# Patient Record
Sex: Male | Born: 1950 | Race: White | Hispanic: No | Marital: Married | Smoking: Never smoker
Health system: Southern US, Community
[De-identification: ages and names within clinical notes are randomized; demographics above are authoritative.]

## PROBLEM LIST (undated history)

## (undated) DIAGNOSIS — C801 Malignant (primary) neoplasm, unspecified: Secondary | ICD-10-CM

## (undated) DIAGNOSIS — I10 Essential (primary) hypertension: Secondary | ICD-10-CM

---

## 2012-11-03 ENCOUNTER — Telehealth: Payer: Self-pay | Admitting: Internal Medicine

## 2012-11-03 NOTE — Telephone Encounter (Signed)
PT SCHEDULED PER MOHAMED 09/10 @ 2:30.

## 2012-11-04 ENCOUNTER — Encounter: Payer: Self-pay | Admitting: Medical Oncology

## 2012-11-04 NOTE — Progress Notes (Unsigned)
Per Cameron Berry in med records she is waiting for [facility] to fax records. Abel to access med list and allergies per care Everywhere

## 2012-11-05 ENCOUNTER — Encounter: Payer: Self-pay | Admitting: Internal Medicine

## 2012-11-05 ENCOUNTER — Other Ambulatory Visit: Payer: BC Managed Care – PPO | Admitting: Lab

## 2012-11-05 ENCOUNTER — Ambulatory Visit (HOSPITAL_BASED_OUTPATIENT_CLINIC_OR_DEPARTMENT_OTHER): Payer: BC Managed Care – PPO | Admitting: Internal Medicine

## 2012-11-05 ENCOUNTER — Ambulatory Visit: Payer: BC Managed Care – PPO

## 2012-11-05 DIAGNOSIS — C3490 Malignant neoplasm of unspecified part of unspecified bronchus or lung: Secondary | ICD-10-CM

## 2012-11-05 NOTE — Progress Notes (Signed)
Checked in new patient. Had to enter all. email for communication. No financial issues.

## 2012-11-05 NOTE — Progress Notes (Signed)
[facility] [phone]   [phone] Multidisciplinary thoracic oncology clinic (MTOC) CONSULT NOTE  REFERRING PHYSICIAN: Dr. Caryl Never  REASON FOR CONSULTATION:  62 years old white male with metastatic non-small cell lung cancer for evaluation of immunotherapy clinical trial.  HPI Cameron Berry is a 62 y.o. male is a never smoker from [place], [place] with past medical history significant for atrial fibrillation status post ablation, history of superficial melanoma status post resection as well as history of kidney stone status post lithotripsy and history of hypertension. The patient mentions that in February of 2014 he started complaining of pain in the right hip area. He was seen by his primary care physician and referred to orthopedics. Limited bone scan done at [facility] on 04/18/2012 revealed diffuse uptake in the right hemipelvis involving the right superior pubic ramus, a right acetabulum, right sacrum and right iliac region. This was followed by MRI of the pelvis on 02/23/2013 and it showed multiple osseous lesions in the bony pelvis with the largest measuring 9.5 CM and infiltrating into the right acetabular area involving the majority of the right neck being service. He was referred to [facility] orthopedics and CT scan of the chest showed a large right-sided pleural effusion with pleural nodularity, subcarinal lymphadenopathy and 7 mm nodule. CT-guided biopsy of the right acetabulum was performed on 04/30/2012 and confirmed the presence of adenocarcinoma consistent with a lung primary. On 05/07/2012 the patient underwent therapeutic and diagnostic right thoracentesis and the cytology was sent for molecular testing including EGFR mutation as well as ALK gene translocation. They mutation analysis that showed positive EGFR mutation in exon 21 (L858R). The patient has negative ALK gene translocation. The patient was seen by radiation oncology and  treated with palliative radiotherapy to the right acetabulum and pelvic lesions for a total of 10 fractions completed in April 2014. He was referred to Dr. Jerre Simon. He was started on treatment with Tarceva 50 mg by mouth daily in April 2014 for around 6 weeks. Restaging scans showed poor response to the treatment. The patient mentions that his dose was increased to 75 mg by mouth daily but he was also started on treatment with carboplatin, Alimta with Neulasta support. First dose was given on 07/08/2012. He completed 4 cycles of this combination of targets therapy and chemotherapy.  Repeat CT scan of the chest, abdomen and pelvis on 10/21/2012 showed extensive right pleural based nodules and masses, compatible with metastatic disease and worsened since prior exam. There was loculated small right pleural effusion, similar to the prior. There was increased cardiophrenic adenopathy. There was sclerotic lesions in the superior endplate of T12 metastases progressed since June of 2014 and indeterminate. CT of the brain on the same day showed no evidence of metastatic disease. 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  Dr. Jerre Simon discussed with the patient several options for his treatment including increasing the dose of Tarceva at 150 mg. She also referred the patient for consideration of clinical trial with AZD 9291 if he has resistant T790M EGFR mutation.  The patient was also referred to me today for evaluation and discussion of the immunotherapy trial with the [facility] [id number] including treatment with Nivolumab.  When seen today the patient was accompanied by his wife, his daughter and her husband. He feels very good today with no specific complaints except for mild cough. The patient exercises  at regular basis. He also complains of fatigue. He denied having any significant nausea or vomiting. He  denied having any fever or chills. The patient denied having any significant chest pain, shortness of breath but continues to have mild cough with no hemoptysis. He has no significant weight loss or night sweats. He has no headache or blurry vision.     PAST MEDICAL HISTORY:Significant for history of metastatic non-small cell lung cancer, superficial melanoma status post excision, history of atrial fibrillation status post ablation , history of kidney stone status post lithotripsy, hypertension. The patient denied having any history of coronary artery disease, diabetes mellitus or stroke.  FAMILY HISTORY: mother had breast cancer, mother still alive at age 55 and maternal grandmother with breast cancer  SOCIAL HISTORY: the patient is married and has 2 daughters. He was accompanied today by his daughter Cameron Berry who was emergency department physician and her husband was also a physician. The patient was also accompanied by his wife, Cameron Berry. He works in a Programme researcher, broadcasting/film/video. He has no history of smoking and drinks alcohol occasionally with no history of drug abuse.   No Known Allergies  Current Outpatient Prescriptions  Medication Sig Dispense Refill  . ALPRAZolam (XANAX) 0.25 MG tablet Take 1 tablet by mouth 3 (three) times daily as needed. Take 1 tablet (0.25 mg total) by mouth 3 (three) times daily as needed.      Marland Kitchen denosumab (XGEVA) 120 MG/1.7ML SOLN injection Inject 120 mg into the skin every 28 (twenty-eight) days. Inject 120 mg subcutaneously every 28 (twenty-eight) days.      Marland Kitchen erlotinib (TARCEVA) 150 MG tablet Take 150 mg by mouth daily. Take 1 tablet (150 mg total) by mouth daily.      . folic acid (FOLVITE) 400 MCG tablet Take 400 mg by mouth daily. Take 800 mcg by mouth daily.      Marland Kitchen LORazepam (ATIVAN) 0.5 MG tablet Take 0.5 mg by mouth every 8 (eight) hours as needed. Take 0.5 mg by mouth every 8 (eight) hours as needed. For anxiety      . metoprolol succinate (TOPROL-XL) 50 MG 24 hr tablet  Take 50 mg by mouth 2 (two) times daily. Take 50 mg by mouth 2 (two) times daily.      Marland Kitchen zolpidem (AMBIEN) 10 MG tablet Take 10 mg by mouth at bedtime as needed. Take 1 tablet (10 mg total) by mouth nightly.      Marland Kitchen acetaminophen (TYLENOL) 500 MG tablet Take 1 tablet by mouth 3 (three) times daily. Take 1,000 mg by mouth 3 (three) times daily as needed for Pain.      . docusate sodium (STOOL SOFTENER) 100 MG capsule Take 100 mg by mouth 2 (two) times daily as needed. Take 100 mg by mouth 2 (two) times daily as needed for Constipation.      . famotidine (PEPCID) 20 MG tablet Take 20 mg by mouth 2 (two) times daily. Take 20 mg by mouth 2 (two) times daily.      Marland Kitchen ibuprofen (ADVIL,MOTRIN) 200 MG tablet Take 200 mg by mouth daily. Take 200 mg by mouth every 8 (eight) hours as needed.      . loperamide (IMODIUM A-D) 2 MG tablet Take 2 mg by mouth 4 (four) times daily as needed. Take 2 mg by mouth 4 (four) times daily as needed for Diarrhea.      . ondansetron (ZOFRAN) 8 MG tablet 8 mg 2 (two) times daily as needed.      Marland Kitchen  ondansetron (ZOFRAN-ODT) 8 MG disintegrating tablet Take 8 mg by mouth every 12 (twelve) hours as needed. Take 8 mg by mouth every 12 (twelve) hours as needed for Nausea.       No current facility-administered medications for this visit.    Review of Systems  Constitutional: negative Eyes: negative Ears, nose, mouth, throat, and face: negative Respiratory: positive for cough Cardiovascular: negative Gastrointestinal: positive for diarrhea Genitourinary:negative Integument/breast: positive for dryness and rash Hematologic/lymphatic: negative Musculoskeletal:negative Neurological: negative Behavioral/Psych: negative Endocrine: negative Allergic/Immunologic: negative  Physical Exam  ZOX:WRUEA, healthy, no distress, well nourished and well developed SKIN: positive for grade 1-2 skin rash mainly on the face, upper chest and shoulder HEAD: Normocephalic EYES: normal,  PERRLA EARS: External ears normal OROPHARYNX:no exudate and no erythema  NECK: supple, no adenopathy LYMPH:  no palpable lymphadenopathy, no hepatosplenomegaly LUNGS: clear to auscultation on the left with decreased breath sounds and dullness to percussion at the lower half of the right lung. HEART: regular rate & rhythm, no murmurs and no gallops ABDOMEN:abdomen soft, non-tender, normal bowel sounds and no masses or organomegaly BACK: Back symmetric, no curvature. EXTREMITIES:no joint deformities, effusion, or inflammation, no cyanosis  NEURO: alert & oriented x 3 with fluent speech, no focal motor/sensory deficits, gait normal  PERFORMANCE STATUS: ECOG 1 LABORATORY DATA: Recent bloodwork from [facility] performed on 10/22/2012. GFR >78ml/min: Normal Range GFR 59-30: Moderate decrease (CKD Stage 3) GFR 29-15: Severe decrease (CKD Stage 4) GFR <15: Kidney Failure (CKD Stage 5) Note: These GFR calculations do not apply in acute situations  when GFR is changing rapidly or in patients on dialysis  . Anion Gap 10/29/2012 7 7-17 mmol/L Final  . BUN/Crea Ratio 10/29/2012 11* 20-30 Final  . Lactate Dehydrogenase (LDH) 10/29/2012 236* 100-200 U/L Final  . Hemoglobin 10/22/2012 11.1* 13.7-17.3 g/dL Final  . MCV (Mean Corpuscular Volume) 10/22/2012 93.3 80.0-98.0 fL Final  . MCH (Mean Corpuscular Hemoglobin) 10/22/2012 30.8 26.5-34.0 pg Final  . Immature Granulocyte Count 10/22/2012 0.04 <1.00 10^3/L Final  . Immature Granulocyte % 10/22/2012 0.6 <1.0 % Final  . NRBC % (Nucleated Reb Blood Cell %) 10/22/2012 0.0 <=0.0 % Final  . Hematocrit 10/22/2012 33.6* 42.0-52.0 % Final  . Neutrophil % 10/22/2012 80.8* 37.0-80.0 % Final  . Lymphocyte % 10/22/2012 7.6* 10.0-50.0 % Final  . Eosinophil % 10/22/2012 0.8 0.0-7.0 % Final  . WBC (White Blood Cell Count) 10/22/2012 6.41 4.80-10.80 10^3/uL Final  . RBC (Red Blood Cell Count) 10/22/2012 3.60* 4.37-5.74 10^6/uL Final  . MCHC (Mean  Corpuscular Hemoglobin * 10/22/2012 33.0 31.5-36.3 g/dL Final  . Plt (platelets) 10/22/2012 164 150-450 10^3/uL Final  . NRBC (Nucleated Red Blood Cell Cou* 10/22/2012 0.00 0.00-2.00 10^3/uL Final  . Neutrophils 10/22/2012 5.18 2.00-8.60 10^3/uL Final  . Lymphocyte Count 10/22/2012 0.49* 0.60-4.50 10^3/uL Final  . MONOCYTE COUNT 10/22/2012 0.60 0.00-0.90 10^3/uL Final  . Eosinophils 10/22/2012 0.05 0.00-0.70 10^3/uL Final  . Basophils 10/22/2012 0.05 0.00-0.20 10^3/uL Final  . Monocyte % 10/22/2012 9.4 0.0-12.0 % Final  . Basophil% 10/22/2012 0.8 <=2.0 % Final  . RDW-CV (Red Cell Distribution Widt* 10/22/2012 15.7* 11.5-14.5 % Final  . Sodium 10/22/2012 140 135-145 mmol/L Final  . Potassium 10/22/2012 3.3* 3.5-5.0 mmol/L Final  . Chloride 10/22/2012 107 98-108 mmol/L Final  . Carbon Dioxide (CO2) 10/22/2012 26 21-30 mmol/L Final  . Glucose 10/22/2012 124 70-140 mg/dL Final  Interpretive Data:  Please note that the above listed reference range  is for NONFASTING GLUCOSE levels only. FASTING GLUCOSE  reference ranges  are shown below:  FASTING GLUCOSE REFERENCE RANGE  NORMAL: 70 - 99 mg/dL  PREDIABETES: 161 - 096 mg/dL  DIABETES: > 045 mg/dL       . Urea Nitrogen (BUN) 10/22/2012 13 7-20 mg/dL Final  . Creatinine 40/98/1191 1.1 0.6-1.3 mg/dL Final  . Calcium 47/82/9562 8.4* 8.7-10.2 mg/dL Final  . Protein, Total 10/22/2012 6.4 5.8-7.8 g/dL Final  . Albumin 13/09/6576 3.7 3.5-4.8 g/dL Final  . Bilirubin, Total 10/22/2012 0.8 0.4-1.5 mg/dL Final  . AST (Aspartate Aminotransferase) 10/22/2012 25 15-41 U/L Final  . Alk Phos (alkaline Phosphatase) 10/22/2012 68 24-110 U/L Final  . Alt (alanine Aminotransfrase) 10/22/2012 20 17-63 U/L Final  . Glomerular Filtration Rate (GFR), * 10/22/2012 >60 Final  Interpretive Ranges for Patients with Chronic Kidney Disease:  ======================================================= GFR >79ml/min: Normal Range GFR 59-30: Moderate decrease (CKD Stage  3) GFR 29-15: Severe decrease (CKD Stage 4) GFR <15: Kidney Failure (CKD Stage 5) Note: These GFR calculations do not apply in acute situations  when GFR is changing rapidly or in patients on dialysis  . Anion Gap 10/22/2012 10 7-17 mmol/L Final  . BUN/Crea Ratio 10/22/2012 12* 20-30 Final  . Lactate Dehydrogenase (LDH) 10/22/2012 222* 100-200 U/L Final      RADIOGRAPHIC STUDIES: CT abdomen with contrast - Final result (10/21/2012 9:11 AM EDT)  Narrative  CT chest and abdomen with IV contrast, dated October 21, 2012  Comparison: September 10, 2012: August 18, 2012; and priors  Indication: 162.9 Malignant neoplasm of bronchus and lung, unspecified  site, f/u lung cancer  Technique: CT imaging was performed of the chest and abdomen following the  uncomplicated administration of intravenous contrast (Isovue-300, 100 mL).  Iodinated contrast was used due to the indications for the examination, to  improve disease detection and to further define anatomy. The most recent  serum creatinine is 1.3 mg/dL. Coronal and sagittal images were also  generated and reviewed.  Findings:  Chest: The thyroid gland appears normal. The heart and great vessels are  unremarkable. There is no pericardial effusion. 0.8 cm AP window node  (series 3, image 24) is unchanged. Right hilar lymph nodes measure up to 1  cm (series 3, image 30), grossly unchanged. No left hilar or axillary  adenopathy is seen. There is cardiophrenic adenopathy; confluent adenopathy  measures up to 2.7 x 1.3 cm (series 3, image 50), increased in size since  prior.  There is extensive enhancing right pleural nodularity, involving both the  visceral and parietal pleura. This has significantly increased since September 10, 2012. Largest confluent pleurally based mass measures 1.6 x 5.2 cm  (series 3, image 47) and is newly noted. Another example of a new nodule at  the 1.1 x 1.2 cm medial right lower lobe pleural-based nodule closely  abutting  the esophagus (series 3, image 52). In the right upper lobe,  largest pleural-based nodule measures 1.3 x 0.9 cm (series 3, image 17),  previously 0.6 x 0.6 cm.  There is a loculated small right pleural effusion, similar to priors.  Linear opacities at the right lung base are similar to prior and likely  represent a combination of atelectasis and mild edema. No pulmonary nodules  are identified in the left lung.  Abdomen: No focal liver lesions are identified. The portal vein is patent.  The gallbladder, spleen, pancreas, and adrenal glands are unremarkable.  There is a 2.2 x 2.4 cm cyst at the upper pole of the left kidney, multiple  subcentimeter hypoattenuating lesions in both  kidneys that are too small to  accurately characterize on CT, and multiple bilateral nonobstructing renal  stones, similar to prior.  There is a small hiatal hernia. There is no evidence for bowel obstruction;  pelvis is not imaged, and thus only part of the bowel is visualized. No  free air or free fluid is seen. There are subcentimeter porta hepatis and  retroperitoneal lymph nodes, unchanged. There is degenerative disease in  the spine. Sclerotic lesion of the superior endplate of T12 is progressed  since June 2014, but given the location, is indeterminate.  Impression:  1. Extensive right pleural-based nodules and masses, compatible with  metastatic disease and worsened since prior. Loculated small right pleural  effusion, similar to prior.  2. Increased cardiophrenic adenopathy.  3. Sclerotic lesion at superior endplate of T12 is progressed since June  2014 and indeterminate; attention on followup.  Electronically Reviewed by: Sheran Fava, MD  Electronically Reviewed on: 10/21/2012 10:35 AM  I have reviewed the images and concur with the above findings.  Electronically Signed by: Gypsy Decant, MD  Electronically Signed on: 10/21/2012 12:35 PM     CT brain with contrast - Final result (10/21/2012  9:11 AM EDT)  Narrative  Brain CT with contrast.  Comparison: Brain MR May 07, 2012. Bone scan October 21, 2012.Marland Kitchen  Contrast: 80 ml Isovue 300. 20 ml wasted.  History: 162.9 Malignant neoplasm of bronchus and lung, unspecified site,  f/u lung cancer.  No intra-axial mass. No abnormal enhancement.  Ventricles are normal in size and configuration.  No evidence of acute cortical infarct or hemorrhage.  Visualized paranasal sinuses are clear. No fracture.  Scattered calvarial lucencies which are likely vascular given absence of  tracer abnormality on accompanying bone scan.  Stable large midline posterior fossa nonenhancing CSF fluid collection.  This likely represents an arachnoid cyst superimposed upon a giant cisterna  magna..  IMPRESSION:  No evidence of metastatic disease.  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  Electronically Signed by: Tana Conch  Electronically Signed on: 10/21/2012 1:07 PM    ASSESSMENT: This is a very pleasant 62 years old white male with metastatic non-small cell lung cancer, adenocarcinoma with positive EGFR mutation in exon 21 (L858R). The patient is status post treatment with low-dose Tarceva as a single agent followed by a combination Tarceva and chemotherapy with carboplatin and Alimta but unfortunately has evidence for disease progression.   PLAN: I had a lengthy discussion with the patient and his family today about his current disease status and treatment options. The patient is currently on treatment with Tarceva 150 mg by mouth daily status post 1 week. He is tolerating his treatment fairly well except for few episodes of diarrhea and grade 1-2 skin rash mainly on the face and upper chest and neck area.  I recommended for the patient to continue on his current dose of Tarceva for the next few week followed by restaging scan.  If he  continues to have evidence for disease progression on the current standard dose of Tarceva, this treatment will need to be discontinued. Repeat biopsy of one of the pleural-based nodules would be recommended to rule out the presence of the resistant mutation T790M.  If the patient has the resistant T790M mutation, he would benefit from enrollment in the clinical trial with [id number] or [id number].  If the patient has evidence for disease progression in the absence  of the resistant T790M mutation, I would be happy to consider him for enrollment in the immunotherapy clinical trial with Anti PD-1, Nivolumab according to the BMS [id number]. I discussed all of these options in detail with the patient and his family and explained to them the pros and cons of each option. I gave the patient and his family the time to ask questions and I answered them completely to their satisfaction. The patient will continue his current care with Dr. Jerre Simon. I would be happy to see him in the future if he is interested in the [facility] 209153 clinical trial.  For skin rash the patient will continue on clindamycin cream. For the metastatic bone disease, the patient will continue on Xgeva and I recommended for him to start taking calcium and vitamin D supplements.  The patient voices understanding of current disease status and treatment options and is in agreement with the current care plan.  All questions were answered. The patient knows to call the clinic with any problems, questions or concerns. We can certainly see the patient much sooner if necessary.  Thank you so much for allowing me to participate in the care of Cameron Berry. I will continue to follow up the patient with you and assist in his care.  I spent 60 minutes counseling the patient face to face. The total time spent in the appointment was 80 minutes.  Zacari Radick K. 11/05/2012, 5:56 PM

## 2012-11-27 ENCOUNTER — Encounter: Payer: Self-pay | Admitting: *Deleted

## 2012-11-27 DIAGNOSIS — C3490 Malignant neoplasm of unspecified part of unspecified bronchus or lung: Secondary | ICD-10-CM | POA: Insufficient documentation

## 2012-11-28 ENCOUNTER — Encounter (HOSPITAL_COMMUNITY): Payer: Self-pay

## 2012-11-28 ENCOUNTER — Other Ambulatory Visit (HOSPITAL_BASED_OUTPATIENT_CLINIC_OR_DEPARTMENT_OTHER): Payer: BC Managed Care – PPO | Admitting: Lab

## 2012-11-28 ENCOUNTER — Other Ambulatory Visit: Payer: Self-pay | Admitting: *Deleted

## 2012-11-28 ENCOUNTER — Ambulatory Visit (HOSPITAL_BASED_OUTPATIENT_CLINIC_OR_DEPARTMENT_OTHER): Payer: BC Managed Care – PPO | Admitting: Internal Medicine

## 2012-11-28 ENCOUNTER — Ambulatory Visit (HOSPITAL_COMMUNITY)
Admission: RE | Admit: 2012-11-28 | Discharge: 2012-11-28 | Disposition: A | Discharge status: Home / Self Care | Payer: BC Managed Care – PPO | Source: Ambulatory Visit | Attending: Internal Medicine | Admitting: Internal Medicine

## 2012-11-28 ENCOUNTER — Encounter: Payer: Self-pay | Admitting: Internal Medicine

## 2012-11-28 VITALS — BP 162/90 | HR 86 | Temp 98.5°F | Resp 18 | Ht 72.0 in | Wt 187.4 lb

## 2012-11-28 DIAGNOSIS — C3490 Malignant neoplasm of unspecified part of unspecified bronchus or lung: Secondary | ICD-10-CM

## 2012-11-28 DIAGNOSIS — Q619 Cystic kidney disease, unspecified: Secondary | ICD-10-CM | POA: Insufficient documentation

## 2012-11-28 DIAGNOSIS — N200 Calculus of kidney: Secondary | ICD-10-CM | POA: Insufficient documentation

## 2012-11-28 DIAGNOSIS — C7951 Secondary malignant neoplasm of bone: Secondary | ICD-10-CM

## 2012-11-28 DIAGNOSIS — C3491 Malignant neoplasm of unspecified part of right bronchus or lung: Secondary | ICD-10-CM

## 2012-11-28 DIAGNOSIS — C782 Secondary malignant neoplasm of pleura: Secondary | ICD-10-CM

## 2012-11-28 HISTORY — DX: Malignant (primary) neoplasm, unspecified: C80.1

## 2012-11-28 HISTORY — DX: Essential (primary) hypertension: I10

## 2012-11-28 LAB — T4, FREE: Free T4: 1.07 ng/dL (ref 0.80–1.80)

## 2012-11-28 LAB — CBC WITH DIFFERENTIAL/PLATELET
BASO%: 0.8 % (ref 0.0–2.0)
Basophils Absolute: 0.1 10*3/uL (ref 0.0–0.1)
EOS%: 5.3 % (ref 0.0–7.0)
Eosinophils Absolute: 0.5 10*3/uL (ref 0.0–0.5)
HGB: 13 g/dL (ref 13.0–17.1)
LYMPH%: 7.5 % — ABNORMAL LOW (ref 14.0–49.0)
MCH: 28 pg (ref 27.2–33.4)
MCHC: 33.2 g/dL (ref 32.0–36.0)
MCV: 84.4 fL (ref 79.3–98.0)
MONO#: 0.7 10*3/uL (ref 0.1–0.9)
MONO%: 8.3 % (ref 0.0–14.0)
NEUT#: 6.6 10*3/uL — ABNORMAL HIGH (ref 1.5–6.5)
NEUT%: 78.1 % — ABNORMAL HIGH (ref 39.0–75.0)
Platelets: 240 10*3/uL (ref 140–400)
RBC: 4.62 10*6/uL (ref 4.20–5.82)
RDW: 15.1 % — ABNORMAL HIGH (ref 11.0–14.6)
WBC: 8.4 10*3/uL (ref 4.0–10.3)

## 2012-11-28 LAB — COMPREHENSIVE METABOLIC PANEL (CC13)
AST: 23 U/L (ref 5–34)
Albumin: 3.5 g/dL (ref 3.5–5.0)
Alkaline Phosphatase: 93 U/L (ref 40–150)
BUN: 11.8 mg/dL (ref 7.0–26.0)
CO2: 26 mEq/L (ref 22–29)
Creatinine: 1.1 mg/dL (ref 0.7–1.3)
Glucose: 87 mg/dl (ref 70–140)
Potassium: 3.8 mEq/L (ref 3.5–5.1)
Sodium: 142 mEq/L (ref 136–145)
Total Protein: 7.4 g/dL (ref 6.4–8.3)

## 2012-11-28 LAB — HEPATITIS B SURFACE ANTIGEN: Hepatitis B Surface Ag: NEGATIVE

## 2012-11-28 LAB — TSH CHCC: TSH: 1.621 m(IU)/L (ref 0.320–4.118)

## 2012-11-28 MED ORDER — IOHEXOL 300 MG/ML  SOLN
100.0000 mL | Freq: Once | INTRAMUSCULAR | Status: AC | PRN
  Administered 2012-11-28: 100 mL via INTRAVENOUS

## 2012-11-28 NOTE — Progress Notes (Signed)
[facility] [phone]   [phone]  OFFICE PROGRESS NOTE  [name], MD [address] [facility] [place] [address]  DIAGNOSIS: metastatic non-small cell lung cancer, adenocarcinoma with positive EGFR mutation in exon 21 (L858R) diagnosed in February of 2014. Recent biopsy showed negative resistant mutation T790M.  PRIOR THERAPY: 1) Palliative radiotherapy to the right acetabulum and pelvic lesions for a total of 10 fractions completed in April 2014. 2) Treatment with Tarceva 50 mg by mouth daily in April 2014 for around 6 weeks. Restaging scans showed poor response to the treatment. 3) Tarceva dose was increased to 75 mg by mouth daily but he was also started on treatment with carboplatin, Alimta with Neulasta support. First dose was given on 07/08/2012. He completed 4 cycles of this combination of targets therapy and chemotherapy. Chemotherapy was discontinued secondary to disease progression. 4) Tarceva 150 mg by mouth daily for one month's discontinued on 11/24/2012 secondary to with significant disease progression in the right pleural space.  5) rebiopsy for evaluation of the resistant mutation T790M was negative.  CURRENT THERAPY:the patient is here today for evaluation and consideration of the Immunotherapy clinical trial BMS CA [id number] for treatment with Nivolumab  INTERVAL HISTORY: Cameron Berry 62 y.o. male returns to the clinic today for follow up visit accompanied by his wife. The patient is feeling fine today with no specific complaints. He still very active and exercises at regular basis. The patient recently had a biopsy of the right pleural based mass and the molecular studies showed negative resistant mutation T790M. His recent CT scan of the chest at [facility] showed significant disease progression in the right pleural based masses. Dr. Jerre Simon kindly referred the patient back to me today for  evaluation and consideration of enrollment in the immunotherapy clinical trial with Nivolumab. The patient has no complaints today. He denied having any significant chest pain, shortness of breath, cough or hemoptysis. He denied having any weight loss or night sweats. He has no nausea or vomiting. He denied having any significant fever or chills.  ALLERGIES:  has No Known Allergies.  MEDICATIONS:  Current Outpatient Prescriptions  Medication Sig Dispense Refill  . acetaminophen (TYLENOL) 500 MG tablet Take 1 tablet by mouth 3 (three) times daily. Take 1,000 mg by mouth 3 (three) times daily as needed for Pain.      Marland Kitchen ALPRAZolam (XANAX) 0.25 MG tablet Take 1 tablet by mouth 3 (three) times daily as needed. Take 1 tablet (0.25 mg total) by mouth 3 (three) times daily as needed.      . clindamycin (CLINDAGEL) 1 % gel       . denosumab (XGEVA) 120 MG/1.7ML SOLN injection Inject 120 mg into the skin every 28 (twenty-eight) days. Inject 120 mg subcutaneously every 28 (twenty-eight) days.      Marland Kitchen docusate sodium (STOOL SOFTENER) 100 MG capsule Take 100 mg by mouth 2 (two) times daily as needed. Take 100 mg by mouth 2 (two) times daily as needed for Constipation.      . erlotinib (TARCEVA) 150 MG tablet Take 150 mg by mouth daily. Take 1 tablet (150 mg total) by mouth daily.      . famotidine (PEPCID) 20 MG tablet Take 20 mg by mouth 2 (two) times daily. Take 20 mg by mouth 2 (two) times daily.      . folic acid (FOLVITE) 400 MCG tablet Take 400 mg by mouth daily. Take  800 mcg by mouth daily.      Marland Kitchen ibuprofen (ADVIL,MOTRIN) 200 MG tablet Take 200 mg by mouth daily. Take 200 mg by mouth every 8 (eight) hours as needed.      . loperamide (IMODIUM A-D) 2 MG tablet Take 2 mg by mouth 4 (four) times daily as needed. Take 2 mg by mouth 4 (four) times daily as needed for Diarrhea.      Marland Kitchen LORazepam (ATIVAN) 0.5 MG tablet Take 0.5 mg by mouth every 8 (eight) hours as needed. Take 0.5 mg by mouth every 8 (eight) hours  as needed. For anxiety      . metoprolol succinate (TOPROL-XL) 50 MG 24 hr tablet Take 50 mg by mouth 2 (two) times daily. Take 50 mg by mouth 2 (two) times daily.      . ondansetron (ZOFRAN) 8 MG tablet 8 mg 2 (two) times daily as needed.      . ondansetron (ZOFRAN-ODT) 8 MG disintegrating tablet Take 8 mg by mouth every 12 (twelve) hours as needed. Take 8 mg by mouth every 12 (twelve) hours as needed for Nausea.      Marland Kitchen zolpidem (AMBIEN) 10 MG tablet Take 10 mg by mouth at bedtime as needed. Take 1 tablet (10 mg total) by mouth nightly.       No current facility-administered medications for this visit.    SURGICAL HISTORY: History reviewed. No pertinent past surgical history.  REVIEW OF SYSTEMS:  Constitutional: negative Eyes: negative Ears, nose, mouth, throat, and face: negative Respiratory: negative Cardiovascular: negative Gastrointestinal: negative Genitourinary:negative Integument/breast: negative Hematologic/lymphatic: negative Musculoskeletal:negative Neurological: negative Behavioral/Psych: negative Endocrine: negative Allergic/Immunologic: negative   PHYSICAL EXAMINATION: General appearance: alert, cooperative and no distress Head: Normocephalic, without obvious abnormality, atraumatic Neck: no adenopathy, no JVD, supple, symmetrical, trachea midline and thyroid not enlarged, symmetric, no tenderness/mass/nodules Lymph nodes: Cervical, supraclavicular, and axillary nodes normal. Resp: diminished breath sounds RLL and dullness to percussion RLL Back: symmetric, no curvature. ROM normal. No CVA tenderness. Cardio: regular rate and rhythm, S1, S2 normal, no murmur, click, rub or gallop GI: soft, non-tender; bowel sounds normal; no masses,  no organomegaly Extremities: extremities normal, atraumatic, no cyanosis or edema Neurologic: Alert and oriented X 3, normal strength and tone. Normal symmetric reflexes. Normal coordination and gait  ECOG PERFORMANCE STATUS: 0 -  Asymptomatic  Blood pressure 162/90, pulse 86, temperature 98.5 F (36.9 C), temperature source Oral, resp. rate 18, height 6' (1.829 m), weight 187 lb 6.4 oz (85.004 kg).  LABORATORY DATA: No results found for this basename: WBC, HGB, HCT, MCV, PLT      Chemistry   No results found for this basename: NA, K, CL, CO2, BUN, CREATININE, GLU   No results found for this basename: CALCIUM, ALKPHOS, AST, ALT, BILITOT       RADIOGRAPHIC STUDIES: CT CHEST AND ABDOMEN WITH CONTRAST  TECHNIQUE:  Multidetector CT imaging of the chest and abdomen was performed  following the standard protocol during bolus administration of  intravenous contrast.  CONTRAST: OMNIPAQUE IOHEXOL 300 MG/ML SOLN  COMPARISON: None  FINDINGS:  CT CHEST FINDINGS  Extensive pleural based tumor is identified encasing right lung.  Within the posterior-medial right lung base the tumor measures 4.8  cm in thickness, image 43/ series 2. At the level of the carina the  posterior medial tumor measures 2.4 cm, image 26/ series 2. At the  level of the thoracic inlet the paramediastinal pleural tumor  measures 10 mm in thickness, image 15/ series  2. There is  interlobular septal thickening within the right lung which may  indicate lymphangitic spread of tumor.  The trachea appears patent and is midline. Multiple small  mediastinal lymph nodes are identified. No right paratracheal or  sub- carinal adenopathy identified. No contralateral mediastinal or  left hilar adenopathy noted. The right hilum is partially encased by  pleural tumor.  No axillary or supraclavicular adenopathy identified. Review of the  visualized bony structures shows no aggressive lytic or sclerotic  bone lesions. There is a nonspecific sclerotic focus within the T12  vertebra measuring 1.5 cm, image 60/ series 5.  CT ABDOMEN AND PELVIS FINDINGS  There is no focal liver abnormality. The gallbladder appears within  normal limits. Normal appearance  of the pancreas. The spleen is  unremarkable.  The adrenal glands both appear normal. Multiple bilateral renal  calculi are identified. The largest right renal stone measures 7 mm,  image 67/ series 2. The largest a left renal calculus measures 5 mm,  image 83/ series 2. Bilateral renal cysts are noted.  Normal caliber of the abdominal aorta. No aneurysm. No upper  abdominal adenopathy identified. No free fluid or fluid collections  identified. The upper abdominal bowel loops are within normal  limits.  Review of the visualized osseous structures shows no aggressive  lytic or sclerotic bone lesions.  IMPRESSION:  CT CHEST IMPRESSION  1. Examination is positive for extensive transpleural spread of  tumor encasing the right lung.  2. Interstitial thickening within the right base is noted. This may  indicate lymphangitic spread of tumor within the right lung. 3.  Nonspecific sclerotic focus within the T12 vertebral body. Cannot  rule out sclerotic bone metastasis. Attention on followup imaging is  advised.  CT ABDOMEN AND PELVIS IMPRESSION  1. No specific features identified to suggest metastatic disease to  the upper abdomen.  2. Bilateral renal calculi.  Electronically Signed  By: Signa Kell M.D.  On: 11/28/2012 15:38   ASSESSMENT AND PLAN: This is a very pleasant 62 years old white male with metastatic non-small cell lung cancer, adenocarcinoma with positive EGFR mutation in exon 21 (L858R) with no evidence for distant mutation T790M but unfortunately did not respond to Tarceva as expected and he continues to have evidence for disease progression especially at the right pleural based masses. He also felt systemic chemotherapy with carboplatin and Alimta. I have a lengthy discussion with the patient today about his current disease status and treatment options. I think the patient would be a good candidate for the immunotherapy clinical trial BMS CA [id number]. I discussed this option  with the patient and his wife in details. He is very interested to proceed with the clinical trial. He will be seen by the clinical research nurse later today. I will arrange for the patient to have repeat CT scan of the abdomen and pelvis as a baseline before starting the treatment. If the patient is eligible for the clinical trial, he is expected to start this treatment in early next week. He would come back for follow up visit at that time. For the metastatic bone disease, the patient will continue treatment with Xgeva 120 mg subcutaneously on monthly basis.the patient can receive this treatment either here in [place] or with Dr. Jerre Simon at [facility] depending on his convenience. I also advised the patient to take calcium and vitamin D. Supplements during his treatment with Rivka Barbara and also to keep a good dental hygiene.  The patient voices understanding of current disease  status and treatment options and is in agreement with the current care plan.  All questions were answered. The patient knows to call the clinic with any problems, questions or concerns. We can certainly see the patient much sooner if necessary.  I spent 20 minutes counseling the patient face to face. The total time spent in the appointment was 30 minutes.

## 2012-11-29 NOTE — Patient Instructions (Signed)
We discussed treatment with immunotherapy with Nivolumab. First cycle expected next week if eligible for the trial.

## 2012-12-02 ENCOUNTER — Telehealth: Payer: Self-pay | Admitting: *Deleted

## 2012-12-02 NOTE — Telephone Encounter (Signed)
Per staff message from research I have scheduled appt 

## 2012-12-02 NOTE — Telephone Encounter (Signed)
Per staff message and POF I have scheduled appts.  JMW  

## 2012-12-04 ENCOUNTER — Encounter: Payer: Self-pay | Admitting: *Deleted

## 2012-12-04 ENCOUNTER — Ambulatory Visit (HOSPITAL_BASED_OUTPATIENT_CLINIC_OR_DEPARTMENT_OTHER): Payer: Self-pay

## 2012-12-04 ENCOUNTER — Telehealth: Payer: Self-pay | Admitting: *Deleted

## 2012-12-04 ENCOUNTER — Ambulatory Visit (HOSPITAL_BASED_OUTPATIENT_CLINIC_OR_DEPARTMENT_OTHER): Payer: BC Managed Care – PPO | Admitting: Physician Assistant

## 2012-12-04 VITALS — BP 165/98 | HR 82 | Temp 98.3°F | Resp 20 | Ht 72.0 in | Wt 189.1 lb

## 2012-12-04 DIAGNOSIS — C782 Secondary malignant neoplasm of pleura: Secondary | ICD-10-CM

## 2012-12-04 DIAGNOSIS — C3410 Malignant neoplasm of upper lobe, unspecified bronchus or lung: Secondary | ICD-10-CM

## 2012-12-04 DIAGNOSIS — Z5112 Encounter for antineoplastic immunotherapy: Secondary | ICD-10-CM

## 2012-12-04 DIAGNOSIS — C3490 Malignant neoplasm of unspecified part of unspecified bronchus or lung: Secondary | ICD-10-CM

## 2012-12-04 DIAGNOSIS — C7951 Secondary malignant neoplasm of bone: Secondary | ICD-10-CM

## 2012-12-04 MED ORDER — SODIUM CHLORIDE 0.9 % IV SOLN
260.0000 mg | Freq: Once | INTRAVENOUS | Status: AC
  Administered 2012-12-04: 260 mg via INTRAVENOUS
  Filled 2012-12-04: qty 26

## 2012-12-04 MED ORDER — SODIUM CHLORIDE 0.9 % IV SOLN
Freq: Once | INTRAVENOUS | Status: AC
  Administered 2012-12-04: 14:00:00 via INTRAVENOUS

## 2012-12-04 NOTE — Progress Notes (Signed)
12/04/12 @14 :45, BMS [id number], Cycle 1 Treatment:  [name], accompanied by his wife Cameron Berry, into the [facility] to see Cameron Loft, PA and to receive his first treatment with nivolumab.  He completed baseline PROs (LCSS and EQ-5D-3L) in the lobby prior to his appointments.  His screening labs can be used for treatment today, but he will have all research labs drawn from his peripheral vein prior to initiation of nivolumab.  He is doing well, walking 3-4 times per week, reports less fatigue after having stopped Tarceva last week.  No apparent rash, has a minimal diarrhea, none since drinking oral contrast prior to CT scans last Friday, October 3rd. He will restart Norvasc 10mg  po daily, which has been on hold due to hypotension during chemotherapy treatment.  He plans to start that this evening, since his blood pressure was elevated again today.  Confirmed again that the last dose of Tarceva was on 11/28/12.  Cameron Berry was given a patient identification card for the study, and cautioned by the research nurse, the PA and the MD to call us with any diarrhea or changing in cough, shortness of breath or wheezing.  He agreed to do so.  Reminded him of the consent for as a good resource for side effects of the nivolumab.  Medication assignment through the [facility] system was done yesterday and provided to the pharmacy then.  Spoke with Adella Hare, RN in the infusion room and provided her with a sign for the pump with duration of drug and the direction to use a 0.22 micron filter for administration of the study drug.

## 2012-12-04 NOTE — Patient Instructions (Signed)
[facility] Discharge Instructions for Patients Receiving Chemotherapy  Today you received the following chemotherapy agents: nilvolumab   To help prevent nausea and vomiting after your treatment, we encourage you to take your nausea medication.  Take it as often as prescribed.     If you develop nausea and vomiting that is not controlled by your nausea medication, call the clinic. If it is after clinic hours your family physician or the after hours number for the clinic or go to the Emergency Department.   BELOW ARE SYMPTOMS THAT SHOULD BE REPORTED IMMEDIATELY:  *FEVER GREATER THAN 100.5 F  *CHILLS WITH OR WITHOUT FEVER  NAUSEA AND VOMITING THAT IS NOT CONTROLLED WITH YOUR NAUSEA MEDICATION  *UNUSUAL SHORTNESS OF BREATH  *UNUSUAL BRUISING OR BLEEDING  TENDERNESS IN MOUTH AND THROAT WITH OR WITHOUT PRESENCE OF ULCERS  *URINARY PROBLEMS  *BOWEL PROBLEMS  UNUSUAL RASH Items with * indicate a potential emergency and should be followed up as soon as possible.  One of the nurses will contact you 24 hours after your treatment. Please let the nurse know about any problems that you may have experienced. Feel free to call the clinic you have any questions or concerns. The clinic phone number is [phone].   I have been informed and understand all the instructions given to me. I know to contact the clinic, my physician, or go to the Emergency Department if any problems should occur. I do not have any questions at this time, but understand that I may call the clinic during office hours   should I have any questions or need assistance in obtaining follow up care.    __________________________________________  _____________  __________ Signature of Patient or Authorized Representative            Date                   Time    __________________________________________ Nurse's Signature

## 2012-12-04 NOTE — Progress Notes (Addendum)
[facility] [phone]   [phone]  OFFICE PROGRESS NOTE  [name], MD [address] [facility] [place] [address]  DIAGNOSIS: metastatic non-small cell lung cancer, adenocarcinoma with positive EGFR mutation in exon 21 (L858R) diagnosed in February of 2014. Recent biopsy showed negative resistant mutation T790M.  PRIOR THERAPY: 1) Palliative radiotherapy to the right acetabulum and pelvic lesions for a total of 10 fractions completed in April 2014. 2) Treatment with Tarceva 50 mg by mouth daily in April 2014 for around 6 weeks. Restaging scans showed poor response to the treatment. 3) Tarceva dose was increased to 75 mg by mouth daily but he was also started on treatment with carboplatin, Alimta with Neulasta support. First dose was given on 07/08/2012. He completed 4 cycles of this combination of targets therapy and chemotherapy. Chemotherapy was discontinued secondary to disease progression. 4) Tarceva 150 mg by mouth daily for one month's discontinued on 11/24/2012 secondary to with significant disease progression in the right pleural space.  5) rebiopsy for evaluation of the resistant mutation T790M was negative.  CURRENT THERAPY:the patient is here today to begin the Immunotherapy clinical trial BMS CA 209-153 for treatment with Nivolumab  INTERVAL HISTORY: Cameron Berry 62 y.o. male returns to the clinic today for follow up visit accompanied by his wife. The patient is feeling fine today with no specific complaints. He still very active and exercises at regular basis. The patient recently had a biopsy of the right pleural based mass and the molecular studies showed negative resistant mutation T790M. His recent CT scan of the chest at [facility] showed significant disease progression in the right pleural based masses. The patient has no complaints today except for some occasional mild shortness of breath but  he is able to perform his activities of daily living.he had some diarrhea but feels was related to the oral contrast she had a drink for his CT scan. He reports that he took his last dose of Tarceva on 11/28/2012. He reports mild headache this morning that totally resolved by taking Aleve. He denied any blurred or double vision related to the headache, nausea vomiting. He been off of his blood pressure medication but plans to resume this since his blood pressure is not well-controlled at this time. He denied having any significant chest pain, other significant shortness of breath, cough or hemoptysis. He denied having any weight loss or night sweats. He has no other nausea or vomiting. He denied having any significant fever or chills. He was found to be eligible for the BMS CA 209-153 immunotherapy clinical trial with Nivolumab.  ALLERGIES:  has No Known Allergies.  MEDICATIONS:  Current Outpatient Prescriptions  Medication Sig Dispense Refill  . acetaminophen (TYLENOL) 500 MG tablet Take 1 tablet by mouth 3 (three) times daily. Take 1,000 mg by mouth 3 (three) times daily as needed for Pain.      Marland Kitchen ALPRAZolam (XANAX) 0.25 MG tablet Take 1 tablet by mouth 3 (three) times daily as needed. Take 1 tablet (0.25 mg total) by mouth 3 (three) times daily as needed.      . Calcium Carbonate-Vitamin D 600-400 MG-UNIT per tablet Take by mouth.      . clindamycin (CLINDAGEL) 1 % gel       . denosumab (XGEVA) 120 MG/1.7ML SOLN injection Inject 120 mg into the skin every 28 (twenty-eight) days. Inject 120 mg subcutaneously every 28 (twenty-eight) days.      Marland Kitchen  docusate sodium (STOOL SOFTENER) 100 MG capsule Take 100 mg by mouth 2 (two) times daily as needed. Take 100 mg by mouth 2 (two) times daily as needed for Constipation.      . famotidine (PEPCID) 20 MG tablet Take 20 mg by mouth 2 (two) times daily. Take 20 mg by mouth 2 (two) times daily.      . folic acid (FOLVITE) 400 MCG tablet Take 400 mg by mouth daily.  Take 800 mcg by mouth daily.      Marland Kitchen ibuprofen (ADVIL,MOTRIN) 200 MG tablet Take 200 mg by mouth daily. Take 200 mg by mouth every 8 (eight) hours as needed.      . loperamide (IMODIUM A-D) 2 MG tablet Take 2 mg by mouth 4 (four) times daily as needed. Take 2 mg by mouth 4 (four) times daily as needed for Diarrhea.      Marland Kitchen LORazepam (ATIVAN) 0.5 MG tablet Take 0.5 mg by mouth every 8 (eight) hours as needed. Take 0.5 mg by mouth every 8 (eight) hours as needed. For anxiety      . metoprolol succinate (TOPROL-XL) 50 MG 24 hr tablet Take 50 mg by mouth 2 (two) times daily. Take 50 mg by mouth 2 (two) times daily.      . ondansetron (ZOFRAN) 8 MG tablet 8 mg 2 (two) times daily as needed.      . ondansetron (ZOFRAN-ODT) 8 MG disintegrating tablet Take 8 mg by mouth every 12 (twelve) hours as needed. Take 8 mg by mouth every 12 (twelve) hours as needed for Nausea.      Marland Kitchen zolpidem (AMBIEN) 10 MG tablet Take 10 mg by mouth at bedtime as needed. Take 1 tablet (10 mg total) by mouth nightly.       No current facility-administered medications for this visit.   Facility-Administered Medications Ordered in Other Visits  Medication Dose Route Frequency Provider Last Rate Last Dose  . 0.9 %  sodium chloride infusion   Intravenous Once Si Gaul, MD      . nivolumab (BMS [id number]) 260 mg in sodium chloride 0.9 % 100 mL chemo infusion  260 mg Intravenous Once Si Gaul, MD 126 mL/hr at 12/04/12 1407 260 mg at 12/04/12 1407    SURGICAL HISTORY: No past surgical history on file.  REVIEW OF SYSTEMS:  Constitutional: negative Eyes: negative Ears, nose, mouth, throat, and face: negative Respiratory: negative Cardiovascular: negative Gastrointestinal: positive for diarrhea Genitourinary:negative Integument/breast: negative Hematologic/lymphatic: negative Musculoskeletal:negative Neurological: negative except for headaches Behavioral/Psych: negative Endocrine: negative Allergic/Immunologic:  negative   PHYSICAL EXAMINATION: General appearance: alert, cooperative and no distress Head: Normocephalic, without obvious abnormality, atraumatic Neck: no adenopathy, no JVD, supple, symmetrical, trachea midline and thyroid not enlarged, symmetric, no tenderness/mass/nodules Lymph nodes: Cervical, supraclavicular, and axillary nodes normal. Resp: diminished breath sounds RLL and dullness to percussion RLL Back: symmetric, no curvature. ROM normal. No CVA tenderness. Cardio: regular rate and rhythm, S1, S2 normal, no murmur, click, rub or gallop GI: soft, non-tender; bowel sounds normal; no masses,  no organomegaly Extremities: extremities normal, atraumatic, no cyanosis or edema Neurologic: Alert and oriented X 3, normal strength and tone. Normal symmetric reflexes. Normal coordination and gait  ECOG PERFORMANCE STATUS: 0 - Asymptomatic  Blood pressure 165/98, pulse 82, temperature 98.3 F (36.8 C), temperature source Oral, resp. rate 20, height 6' (1.829 m), weight 189 lb 1.6 oz (85.775 kg), SpO2 98.00%.  LABORATORY DATA: Lab Results  Component Value Date   WBC 8.4 11/28/2012  Chemistry      Component Value Date/Time   NA 142 11/28/2012 1318      Component Value Date/Time   CALCIUM 9.6 11/28/2012 1318       RADIOGRAPHIC STUDIES: CT CHEST AND ABDOMEN WITH CONTRAST  TECHNIQUE:  Multidetector CT imaging of the chest and abdomen was performed  following the standard protocol during bolus administration of  intravenous contrast.  CONTRAST: OMNIPAQUE IOHEXOL 300 MG/ML SOLN  COMPARISON: None  FINDINGS:  CT CHEST FINDINGS  Extensive pleural based tumor is identified encasing right lung.  Within the posterior-medial right lung base the tumor measures 4.8  cm in thickness, image 43/ series 2. At the level of the carina the  posterior medial tumor measures 2.4 cm, image 26/ series 2. At the  level of the thoracic inlet the paramediastinal pleural tumor  measures 10  mm in thickness, image 15/ series 2. There is  interlobular septal thickening within the right lung which may  indicate lymphangitic spread of tumor.  The trachea appears patent and is midline. Multiple small  mediastinal lymph nodes are identified. No right paratracheal or  sub- carinal adenopathy identified. No contralateral mediastinal or  left hilar adenopathy noted. The right hilum is partially encased by  pleural tumor.  No axillary or supraclavicular adenopathy identified. Review of the  visualized bony structures shows no aggressive lytic or sclerotic  bone lesions. There is a nonspecific sclerotic focus within the T12  vertebra measuring 1.5 cm, image 60/ series 5.  CT ABDOMEN AND PELVIS FINDINGS  There is no focal liver abnormality. The gallbladder appears within  normal limits. Normal appearance of the pancreas. The spleen is  unremarkable.  The adrenal glands both appear normal. Multiple bilateral renal  calculi are identified. The largest right renal stone measures 7 mm,  image 67/ series 2. The largest a left renal calculus measures 5 mm,  image 83/ series 2. Bilateral renal cysts are noted.  Normal caliber of the abdominal aorta. No aneurysm. No upper  abdominal adenopathy identified. No free fluid or fluid collections  identified. The upper abdominal bowel loops are within normal  limits.  Review of the visualized osseous structures shows no aggressive  lytic or sclerotic bone lesions.  IMPRESSION:  CT CHEST IMPRESSION  1. Examination is positive for extensive transpleural spread of  tumor encasing the right lung.  2. Interstitial thickening within the right base is noted. This may  indicate lymphangitic spread of tumor within the right lung. 3.  Nonspecific sclerotic focus within the T12 vertebral body. Cannot  rule out sclerotic bone metastasis. Attention on followup imaging is  advised.  CT ABDOMEN AND PELVIS IMPRESSION  1. No specific features identified to  suggest metastatic disease to  the upper abdomen.  2. Bilateral renal calculi.  Electronically Signed  By: Signa Kell M.D.  On: 11/28/2012 15:38   ASSESSMENT AND PLAN: This is a very pleasant 62 years old white male with metastatic non-small cell lung cancer, adenocarcinoma with positive EGFR mutation in exon 21 (L858R) with no evidence for distant mutation T790M but unfortunately did not respond to Tarceva as expected and he continues to have evidence for disease progression especially at the right pleural based masses. He also failed systemic chemotherapy with carboplatin and Alimta. Patient was discussed with also seen by Dr. Arbutus Ped. As he is found eligible for the immunotherapy clinical trial BMS CA [id number], we'll proceed with cycle #1 today of Nivolumab. He'll return in 2 weeks as per protocol for  another symptom management visit prior to cycle #2.  For the metastatic bone disease, the patient will continue treatment with Xgeva 120 mg subcutaneously on monthly basis.the patient can receive this treatment either here in [place] or with Dr. Oran Rein at [facility] depending on his convenience. The patient is to continue to take calcium and vitamin D. supplements during his treatment with Rivka Barbara and also to keep a good dental hygiene.  Conni Slipper, PA-C    The patient voices understanding of current disease status and treatment options and is in agreement with the current care plan.  All questions were answered. The patient knows to call the clinic with any problems, questions or concerns. We can certainly see the patient much sooner if necessary.  I spent 20 minutes counseling the patient face to face. The total time spent in the appointment was 30 minutes.     ADDENDUM: Hematology/Oncology Attending: I had face to face encounter with the patient. I recommended his care plan. The patient came to the clinic today for evaluation before starting the first dose of his  immunotherapy with Nivolumab. He is feeling fine and the blood work and prescreening showed that the patient and his eligible for the clinical trial. He denied having any significant chest pain, shortness breath, cough or hemoptysis. Has only mild fatigue. He denied having any weight loss or night sweats. He would come back for followup visit in 2 weeks with the next cycle of his treatment. Lajuana Matte., MD 12/06/2012

## 2012-12-04 NOTE — Patient Instructions (Addendum)
Follow up in 2 weeks Call immediately for any concerning signs or symptoms

## 2012-12-04 NOTE — Telephone Encounter (Signed)
Per staff message and POF I have scheduled appts.  JMW  

## 2012-12-05 ENCOUNTER — Telehealth: Payer: Self-pay

## 2012-12-05 NOTE — Telephone Encounter (Signed)
Message copied by Lorine Bears on Fri Dec 05, 2012  1:10 PM ------      Message from: Adella Hare K      Created: Thu Dec 04, 2012  2:54 PM      Regarding: 1st time chemo      Contact: [phone]       [phone].            Nilvolumab - research  ------

## 2012-12-05 NOTE — Telephone Encounter (Signed)
Cameron Berry is doing fine after his treatment yesterday.  No problems with nause, vomitin, or diarrhea.  He had a headache pushed fluids and it disapated.  Told him that the Zofran can cause a headache.  He knows to call [phone] if he has any issues arise.

## 2012-12-06 ENCOUNTER — Encounter: Payer: Self-pay | Admitting: Physician Assistant

## 2012-12-08 ENCOUNTER — Inpatient Hospital Stay
Admission: RE | Admit: 2012-12-08 | Discharge: 2012-12-08 | Disposition: A | Discharge status: Home / Self Care | Payer: Self-pay | Source: Ambulatory Visit | Attending: Internal Medicine | Admitting: Internal Medicine

## 2012-12-08 ENCOUNTER — Other Ambulatory Visit: Payer: Self-pay | Admitting: Internal Medicine

## 2012-12-08 ENCOUNTER — Other Ambulatory Visit: Payer: Self-pay

## 2012-12-08 ENCOUNTER — Telehealth: Payer: Self-pay | Admitting: Internal Medicine

## 2012-12-08 NOTE — Telephone Encounter (Signed)
appts for 10/22 and 11/5 already on schedule.

## 2012-12-09 ENCOUNTER — Other Ambulatory Visit: Payer: Self-pay | Admitting: Internal Medicine

## 2012-12-09 ENCOUNTER — Inpatient Hospital Stay
Admission: RE | Admit: 2012-12-09 | Discharge: 2012-12-09 | Disposition: A | Discharge status: Home / Self Care | Payer: Self-pay | Source: Ambulatory Visit | Attending: Internal Medicine | Admitting: Internal Medicine

## 2012-12-11 ENCOUNTER — Other Ambulatory Visit: Payer: Self-pay | Admitting: Internal Medicine

## 2012-12-11 ENCOUNTER — Inpatient Hospital Stay
Admission: RE | Admit: 2012-12-11 | Discharge: 2012-12-11 | Disposition: A | Discharge status: Home / Self Care | Payer: Self-pay | Source: Ambulatory Visit | Attending: Internal Medicine | Admitting: Internal Medicine

## 2012-12-15 ENCOUNTER — Other Ambulatory Visit: Payer: Self-pay | Admitting: Internal Medicine

## 2012-12-16 ENCOUNTER — Other Ambulatory Visit (HOSPITAL_BASED_OUTPATIENT_CLINIC_OR_DEPARTMENT_OTHER): Payer: BC Managed Care – PPO | Admitting: Lab

## 2012-12-16 ENCOUNTER — Ambulatory Visit (HOSPITAL_BASED_OUTPATIENT_CLINIC_OR_DEPARTMENT_OTHER): Payer: Self-pay

## 2012-12-16 ENCOUNTER — Encounter: Payer: Self-pay | Admitting: *Deleted

## 2012-12-16 ENCOUNTER — Ambulatory Visit (HOSPITAL_BASED_OUTPATIENT_CLINIC_OR_DEPARTMENT_OTHER): Payer: BC Managed Care – PPO | Admitting: Physician Assistant

## 2012-12-16 VITALS — BP 148/72 | HR 91 | Temp 98.7°F | Resp 20 | Ht 72.0 in | Wt 189.3 lb

## 2012-12-16 DIAGNOSIS — Z5112 Encounter for antineoplastic immunotherapy: Secondary | ICD-10-CM

## 2012-12-16 DIAGNOSIS — C782 Secondary malignant neoplasm of pleura: Secondary | ICD-10-CM

## 2012-12-16 DIAGNOSIS — C3490 Malignant neoplasm of unspecified part of unspecified bronchus or lung: Secondary | ICD-10-CM

## 2012-12-16 DIAGNOSIS — C7951 Secondary malignant neoplasm of bone: Secondary | ICD-10-CM

## 2012-12-16 LAB — CBC WITH DIFFERENTIAL/PLATELET
BASO%: 1.2 % (ref 0.0–2.0)
LYMPH%: 6.4 % — ABNORMAL LOW (ref 14.0–49.0)
MCHC: 33.4 g/dL (ref 32.0–36.0)
MONO#: 1.2 10*3/uL — ABNORMAL HIGH (ref 0.1–0.9)
NEUT#: 8 10*3/uL — ABNORMAL HIGH (ref 1.5–6.5)
Platelets: 300 10*3/uL (ref 140–400)
RBC: 4.15 10*6/uL — ABNORMAL LOW (ref 4.20–5.82)
RDW: 15.9 % — ABNORMAL HIGH (ref 11.0–14.6)
WBC: 10.4 10*3/uL — ABNORMAL HIGH (ref 4.0–10.3)
lymph#: 0.7 10*3/uL — ABNORMAL LOW (ref 0.9–3.3)

## 2012-12-16 LAB — COMPREHENSIVE METABOLIC PANEL (CC13)
ALT: 22 U/L (ref 0–55)
Albumin: 2.5 g/dL — ABNORMAL LOW (ref 3.5–5.0)
Anion Gap: 11 mEq/L (ref 3–11)
BUN: 12.2 mg/dL (ref 7.0–26.0)
CO2: 24 mEq/L (ref 22–29)
Calcium: 9.1 mg/dL (ref 8.4–10.4)
Chloride: 102 mEq/L (ref 98–109)
Potassium: 3.7 mEq/L (ref 3.5–5.1)
Sodium: 138 mEq/L (ref 136–145)
Total Bilirubin: 0.47 mg/dL (ref 0.20–1.20)
Total Protein: 6.6 g/dL (ref 6.4–8.3)

## 2012-12-16 LAB — LACTATE DEHYDROGENASE (CC13): LDH: 293 U/L — ABNORMAL HIGH (ref 125–245)

## 2012-12-16 MED ORDER — SODIUM CHLORIDE 0.9 % IV SOLN
Freq: Once | INTRAVENOUS | Status: AC
  Administered 2012-12-16: 12:00:00 via INTRAVENOUS

## 2012-12-16 MED ORDER — SODIUM CHLORIDE 0.9 % IV SOLN
260.0000 mg | Freq: Once | INTRAVENOUS | Status: AC
  Administered 2012-12-16: 260 mg via INTRAVENOUS
  Filled 2012-12-16: qty 26

## 2012-12-16 NOTE — Progress Notes (Addendum)
[facility] Telephone:([phone]   [phone]  OFFICE PROGRESS NOTE  [name], MD [address] [facility] [place] [address]  DIAGNOSIS: metastatic non-small cell lung cancer, adenocarcinoma with positive EGFR mutation in exon 21 (L858R) diagnosed in February of 2014. Recent biopsy showed negative resistant mutation T790M.  PRIOR THERAPY: 1) Palliative radiotherapy to the right acetabulum and pelvic lesions for a total of 10 fractions completed in April 2014. 2) Treatment with Tarceva 50 mg by mouth daily in April 2014 for around 6 weeks. Restaging scans showed poor response to the treatment. 3) Tarceva dose was increased to 75 mg by mouth daily but he was also started on treatment with carboplatin, Alimta with Neulasta support. First dose was given on 07/08/2012. He completed 4 cycles of this combination of targets therapy and chemotherapy. Chemotherapy was discontinued secondary to disease progression. 4) Tarceva 150 mg by mouth daily for one month's discontinued on 11/24/2012 secondary to with significant disease progression in the right pleural space.  5) rebiopsy for evaluation of the resistant mutation T790M was negative.  CURRENT THERAPY:the patient is here today to begin the Immunotherapy clinical trial BMS CA 209-153 for treatment with Nivolumab. Status post 1 cycle  INTERVAL HISTORY: Cameron Berry 62 y.o. male returns to the clinic today for follow up visit accompanied by his wife. He does live just returned from a visit to [place] and did a lot of walking. He developed fever with MAXIMUM TEMPERATURE of 11.7 on Saturday and went to his local emergency room. He was placed on a course of Augmentin as well as a Z-Pak. Other than the fever he had no other specific symptoms. He reports that his cough is stable he initially had some chills but this resolved with Tylenol. He's taken Tylenol or ibuprofen off and on throughout the  weekend. His last dose of Tylenol was about 5:00 this morning when he took 2 500 mg Tylenol tablets as he felt slightly achy. He complains of some fatigue. Last time in the fever was Sunday afternoon. He voices no specific complaints today. He states that he feels fine. His appetite is good. He denies any nausea, vomiting, diarrhea or constipation. He's had no bleeding or bruising.  He denied having any weight loss or night sweats. He has no other nausea or vomiting. He denied having any significant fever or chills. He was found to be eligible for the BMS CA 209-153 immunotherapy clinical trial with Nivolumab and is status post one cycle. He presents for reevaluation of his febrile symptom as well as to proceed with cycle #2  ALLERGIES:  has No Known Allergies.  MEDICATIONS:  Current Outpatient Prescriptions  Medication Sig Dispense Refill  . acetaminophen (TYLENOL) 500 MG tablet Take 1 tablet by mouth 3 (three) times daily. Take 1,000 mg by mouth 3 (three) times daily as needed for Pain.      Marland Kitchen ALPRAZolam (XANAX) 0.25 MG tablet Take 1 tablet by mouth 3 (three) times daily as needed. Take 1 tablet (0.25 mg total) by mouth 3 (three) times daily as needed.      . Calcium Carbonate-Vitamin D 600-400 MG-UNIT per tablet Take by mouth.      . clindamycin (CLINDAGEL) 1 % gel       . denosumab (XGEVA) 120 MG/1.7ML SOLN injection Inject 120 mg into the skin every 28 (twenty-eight) days. Inject 120 mg subcutaneously every 28 (twenty-eight) days.      Marland Kitchen  docusate sodium (STOOL SOFTENER) 100 MG capsule Take 100 mg by mouth 2 (two) times daily as needed. Take 100 mg by mouth 2 (two) times daily as needed for Constipation.      . famotidine (PEPCID) 20 MG tablet Take 20 mg by mouth 2 (two) times daily. Take 20 mg by mouth 2 (two) times daily.      . folic acid (FOLVITE) 400 MCG tablet Take 400 mg by mouth daily. Take 800 mcg by mouth daily.      Marland Kitchen ibuprofen (ADVIL,MOTRIN) 200 MG tablet Take 200 mg by mouth daily.  Take 200 mg by mouth every 8 (eight) hours as needed.      . loperamide (IMODIUM A-D) 2 MG tablet Take 2 mg by mouth 4 (four) times daily as needed. Take 2 mg by mouth 4 (four) times daily as needed for Diarrhea.      Marland Kitchen LORazepam (ATIVAN) 0.5 MG tablet Take 0.5 mg by mouth every 8 (eight) hours as needed. Take 0.5 mg by mouth every 8 (eight) hours as needed. For anxiety      . metoprolol succinate (TOPROL-XL) 50 MG 24 hr tablet Take 50 mg by mouth 2 (two) times daily. Take 50 mg by mouth 2 (two) times daily.      . ondansetron (ZOFRAN) 8 MG tablet 8 mg 2 (two) times daily as needed.      . ondansetron (ZOFRAN-ODT) 8 MG disintegrating tablet Take 8 mg by mouth every 12 (twelve) hours as needed. Take 8 mg by mouth every 12 (twelve) hours as needed for Nausea.      Marland Kitchen zolpidem (AMBIEN) 10 MG tablet Take 10 mg by mouth at bedtime as needed. Take 1 tablet (10 mg total) by mouth nightly.       No current facility-administered medications for this visit.    SURGICAL HISTORY: No past surgical history on file.  REVIEW OF SYSTEMS:  Constitutional: positive for fevers Eyes: negative Ears, nose, mouth, throat, and face: negative Respiratory: positive for cough and That is stable and nonproductive Cardiovascular: negative Gastrointestinal: negative Genitourinary:negative Integument/breast: negative Hematologic/lymphatic: negative Musculoskeletal:negative Neurological: negative Behavioral/Psych: negative Endocrine: negative Allergic/Immunologic: negative   PHYSICAL EXAMINATION: General appearance: alert, cooperative and no distress Head: Normocephalic, without obvious abnormality, atraumatic Neck: no adenopathy, no JVD, supple, symmetrical, trachea midline and thyroid not enlarged, symmetric, no tenderness/mass/nodules Lymph nodes: Cervical, supraclavicular, and axillary nodes normal. Resp: diminished breath sounds RLL and dullness to percussion RLL Back: symmetric, no curvature. ROM normal. No CVA  tenderness. Cardio: regular rate and rhythm, S1, S2 normal, no murmur, click, rub or gallop GI: soft, non-tender; bowel sounds normal; no masses,  no organomegaly Extremities: extremities normal, atraumatic, no cyanosis or edema Neurologic: Alert and oriented X 3, normal strength and tone. Normal symmetric reflexes. Normal coordination and gait  ECOG PERFORMANCE STATUS: 0 - Asymptomatic  Blood pressure 148/72, pulse 91, temperature 98.7 F (37.1 C), temperature source Oral, resp. rate 20, height 6' (1.829 m), weight 189 lb 4.8 oz (85.866 kg), SpO2 98.00%.  LABORATORY DATA: Lab Results  Component Value Date   WBC 10.4* 12/16/2012      Chemistry      Component Value Date/Time   NA 138 12/16/2012 0851      Component Value Date/Time   CALCIUM 9.1 12/16/2012 0851       RADIOGRAPHIC STUDIES: CT CHEST AND ABDOMEN WITH CONTRAST  TECHNIQUE:  Multidetector CT imaging of the chest and abdomen was performed  following the standard protocol during bolus  administration of  intravenous contrast.  CONTRAST: OMNIPAQUE IOHEXOL 300 MG/ML SOLN  COMPARISON: None  FINDINGS:  CT CHEST FINDINGS  Extensive pleural based tumor is identified encasing right lung.  Within the posterior-medial right lung base the tumor measures 4.8  cm in thickness, image 43/ series 2. At the level of the carina the  posterior medial tumor measures 2.4 cm, image 26/ series 2. At the  level of the thoracic inlet the paramediastinal pleural tumor  measures 10 mm in thickness, image 15/ series 2. There is  interlobular septal thickening within the right lung which may  indicate lymphangitic spread of tumor.  The trachea appears patent and is midline. Multiple small  mediastinal lymph nodes are identified. No right paratracheal or  sub- carinal adenopathy identified. No contralateral mediastinal or  left hilar adenopathy noted. The right hilum is partially encased by  pleural tumor.  No axillary or  supraclavicular adenopathy identified. Review of the  visualized bony structures shows no aggressive lytic or sclerotic  bone lesions. There is a nonspecific sclerotic focus within the T12  vertebra measuring 1.5 cm, image 60/ series 5.  CT ABDOMEN AND PELVIS FINDINGS  There is no focal liver abnormality. The gallbladder appears within  normal limits. Normal appearance of the pancreas. The spleen is  unremarkable.  The adrenal glands both appear normal. Multiple bilateral renal  calculi are identified. The largest right renal stone measures 7 mm,  image 67/ series 2. The largest a left renal calculus measures 5 mm,  image 83/ series 2. Bilateral renal cysts are noted.  Normal caliber of the abdominal aorta. No aneurysm. No upper  abdominal adenopathy identified. No free fluid or fluid collections  identified. The upper abdominal bowel loops are within normal  limits.  Review of the visualized osseous structures shows no aggressive  lytic or sclerotic bone lesions.  IMPRESSION:  CT CHEST IMPRESSION  1. Examination is positive for extensive transpleural spread of  tumor encasing the right lung.  2. Interstitial thickening within the right base is noted. This may  indicate lymphangitic spread of tumor within the right lung. 3.  Nonspecific sclerotic focus within the T12 vertebral body. Cannot  rule out sclerotic bone metastasis. Attention on followup imaging is  advised.  CT ABDOMEN AND PELVIS IMPRESSION  1. No specific features identified to suggest metastatic disease to  the upper abdomen.  2. Bilateral renal calculi.  Electronically Signed  By: Signa Kell M.D.  On: 11/28/2012 15:38   ASSESSMENT AND PLAN: This is a very pleasant 62 years old white male with metastatic non-small cell lung cancer, adenocarcinoma with positive EGFR mutation in exon 21 (L858R) with no evidence for distant mutation T790M but unfortunately did not respond to Tarceva as expected and he continues to  have evidence for disease progression especially at the right pleural based masses. He also failed systemic chemotherapy with carboplatin and Alimta. He was found eligible for the immunotherapy clinical trial BMS CA [id number], and is status post 1 cycle. Review of his records from the [facility] emergency room visit on 12/13/2012 weren't thoroughly reviewed including all laboratory and radiology reports. These were reviewed with Dr. Clelia Croft as well. Was deemed that he may proceed with cycle #2 of his nodal met. We advise him to continue on his antibiotics as prescribed. He is having some mild diarrhea likely associated with the Augmentin. We advised him to try taking a probiotic while on this medication. Both the patient and his wife voiced understanding.  He will followup in 2 weeks prior to cycle #3. Patient was discussed with also seen by Dr.Shadad.   For the metastatic bone disease, the patient will continue treatment with Xgeva 120 mg subcutaneously on monthly basis.the patient can receive this treatment either here in [place] or with Dr. Oran Rein at [facility] depending on his convenience. The patient is to continue to take calcium and vitamin D. supplements during his treatment with Rivka Barbara and also to keep a good dental hygiene.  Cameron Slipper, PA-C    The patient voices understanding of current disease status and treatment options and is in agreement with the current care plan.  All questions were answered. The patient knows to call the clinic with any problems, questions or concerns. We can certainly see the patient much sooner if necessary.     Attending Addendum:  Patient seen and examined. He reports feeling well without any chills or sweats today.   His exam: A/O male not in any distress. Vitals reviewed and are normal.  Heart/lung exam: normal. Skin: No rash or lesions.  Impression: Pleasant 62 year old man with lung cancer on Nivolumab as a port of clinical trail.  I  have reviewed all his medical records as well as his laboratory data and vitals. I see no problems with proceeding with the second cycle of his treatment. I do believe that his fever is likely related to the medication and not a sign of infection. But I have educated him about signs and symptoms of sepsis and he is to report to [place] soon as possible need a followup any of those symptoms. All his questions are answered today.  [facility] MD 12/18/2012

## 2012-12-16 NOTE — Progress Notes (Signed)
December 16, 2012 @ 11:00am:  [name] into the [facility] for labs, to see Cameron Loft, PA and to receive treatment with  Nivolumab.  [name] reports a few new adverse events.  He has had an intermittent fever since the evening of December 13, 2012 with a maximum of 101.7 (grade 1).  He was seen at the ED in his home town where he had a chest x-ray, blood work and a urinalysis.  He was sent home after a brief observation after receiving one dose of an antibiotic.  He was given prescriptions for Augmentin and azithromycin, even though there was no other signs of infection.  He cough is the very same cough that he has had for months.  He also report three diarrhea stools yesterday, but this was stopped by the use of loperamide.  He has had one stool today (grade 1).  He agrees to continue to manage diarrhea aggressively and call if it is not well controlled.  He also reported some muscle aches, again intermittently but are completely improved with Tylenol.  He does continue to have the fatigue that was present at baseline.    Spoke with Cameron Amble, RN in the infusion room regarding the treatment for today.  Provided her with a sign and the nurse handout for the drug.  Alerted her to the fact that the drug must be administered through a 0.22 micron filter.  12/18/12 @1 :16 pm:  Received a message from Cameron Berry reporting a change in his vision.  His vision is blurry and he has a bing circle in his visual field on the right eye.  He also reports a small black circle in the visual field of his left eye.  Spoke with Dr. Arbutus Cameron Berry who advised that see his eye doctor as soon as possible.  He also wants the patient to be set up for a brain MRI, since it has been over 2 months since his brain was evaluated.  His Berry called back to say they will see his optometrist tomorrow at 12:30.  The could not obtain an appointment with the opthalmologist sooner than next week.  Called Dr. Unk Berry office and asked that he be  scheduled for a brain MRI in [place], since his residence is in [place], [place].  They were glad to accommodate.  12/19/12 @ 1:15 pm:  Received a call from Cameron Berry that he has been diagnosed with retinal occlusion.  There is edema due to the occlusion and he is being referred immediately to retinal specialist.  He will probably have to have some immediate procedure in order to preserve his vision.  Contacted Dr. Arbutus Cameron Berry, and provided his cell phone number to the retinal specialist, should they need to contact Dr. Arbutus Cameron Berry prior to intervention.  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  The brain MRI from 12/19/12 was negative for intracranial disease.  He is seeing an oncologic opthomalmogist later this week.  He might require radiation therapy to the eye.  Conveyed this information to Dr. Arbutus Cameron Berry.  Will contact study chair through [facility] for direction.  01/07/13 @ 9:00 am:  Received an email from Cameron Berry who reported today is the last day of radiation therapy.  We anticipate resuming treatment with nivolumab in one week with a delayed Cycle 3.

## 2012-12-17 ENCOUNTER — Ambulatory Visit: Payer: BC Managed Care – PPO

## 2012-12-17 ENCOUNTER — Other Ambulatory Visit: Payer: BC Managed Care – PPO | Admitting: Lab

## 2012-12-17 ENCOUNTER — Ambulatory Visit: Payer: BC Managed Care – PPO | Admitting: Physician Assistant

## 2012-12-17 NOTE — Patient Instructions (Signed)
Complete her course of antibiotics as prescribed. Take a probiotic while you're taking the Augmentin and the first few days after you complete that particular antibiotic as this may help with the diarrhea associated with this antibiotic Followup in 2 weeks

## 2012-12-23 ENCOUNTER — Ambulatory Visit: Payer: BC Managed Care – PPO | Admitting: Internal Medicine

## 2012-12-31 ENCOUNTER — Ambulatory Visit: Payer: BC Managed Care – PPO | Admitting: Internal Medicine

## 2012-12-31 ENCOUNTER — Ambulatory Visit: Payer: BC Managed Care – PPO

## 2012-12-31 ENCOUNTER — Other Ambulatory Visit: Payer: BC Managed Care – PPO | Admitting: Lab

## 2013-01-01 ENCOUNTER — Other Ambulatory Visit: Payer: Self-pay

## 2013-01-12 ENCOUNTER — Telehealth: Payer: Self-pay | Admitting: *Deleted

## 2013-01-12 NOTE — Telephone Encounter (Signed)
Per staff message and POF I have scheduled appts.  JMW  

## 2013-01-14 ENCOUNTER — Encounter: Payer: Self-pay | Admitting: *Deleted

## 2013-01-14 ENCOUNTER — Encounter: Payer: Self-pay | Admitting: Internal Medicine

## 2013-01-14 ENCOUNTER — Telehealth: Payer: Self-pay | Admitting: Medical Oncology

## 2013-01-14 ENCOUNTER — Ambulatory Visit (HOSPITAL_BASED_OUTPATIENT_CLINIC_OR_DEPARTMENT_OTHER): Payer: BC Managed Care – PPO | Admitting: Internal Medicine

## 2013-01-14 ENCOUNTER — Other Ambulatory Visit (HOSPITAL_BASED_OUTPATIENT_CLINIC_OR_DEPARTMENT_OTHER): Payer: BC Managed Care – PPO | Admitting: Lab

## 2013-01-14 ENCOUNTER — Ambulatory Visit (HOSPITAL_BASED_OUTPATIENT_CLINIC_OR_DEPARTMENT_OTHER): Payer: Self-pay

## 2013-01-14 VITALS — BP 152/84 | HR 80 | Temp 98.2°F | Resp 19 | Ht 72.0 in | Wt 179.3 lb

## 2013-01-14 DIAGNOSIS — C7951 Secondary malignant neoplasm of bone: Secondary | ICD-10-CM

## 2013-01-14 DIAGNOSIS — C3490 Malignant neoplasm of unspecified part of unspecified bronchus or lung: Secondary | ICD-10-CM

## 2013-01-14 DIAGNOSIS — C782 Secondary malignant neoplasm of pleura: Secondary | ICD-10-CM

## 2013-01-14 DIAGNOSIS — C3410 Malignant neoplasm of upper lobe, unspecified bronchus or lung: Secondary | ICD-10-CM

## 2013-01-14 DIAGNOSIS — Z5112 Encounter for antineoplastic immunotherapy: Secondary | ICD-10-CM

## 2013-01-14 LAB — CBC WITH DIFFERENTIAL/PLATELET
BASO%: 0.9 % (ref 0.0–2.0)
Basophils Absolute: 0.1 10*3/uL (ref 0.0–0.1)
EOS%: 6.7 % (ref 0.0–7.0)
HGB: 10.7 g/dL — ABNORMAL LOW (ref 13.0–17.1)
MCH: 24.3 pg — ABNORMAL LOW (ref 27.2–33.4)
MCHC: 31.8 g/dL — ABNORMAL LOW (ref 32.0–36.0)
MONO#: 0.8 10*3/uL (ref 0.1–0.9)
NEUT#: 7.3 10*3/uL — ABNORMAL HIGH (ref 1.5–6.5)
RBC: 4.38 10*6/uL (ref 4.20–5.82)
RDW: 18.1 % — ABNORMAL HIGH (ref 11.0–14.6)
lymph#: 0.6 10*3/uL — ABNORMAL LOW (ref 0.9–3.3)

## 2013-01-14 LAB — COMPREHENSIVE METABOLIC PANEL (CC13)
ALT: 8 U/L (ref 0–55)
AST: 18 U/L (ref 5–34)
Albumin: 3 g/dL — ABNORMAL LOW (ref 3.5–5.0)
Alkaline Phosphatase: 183 U/L — ABNORMAL HIGH (ref 40–150)
Anion Gap: 11 mEq/L (ref 3–11)
Calcium: 9 mg/dL (ref 8.4–10.4)
Chloride: 103 mEq/L (ref 98–109)
Potassium: 4.6 mEq/L (ref 3.5–5.1)
Sodium: 137 mEq/L (ref 136–145)

## 2013-01-14 MED ORDER — MIRTAZAPINE 30 MG PO TABS: 30.0000 mg | ORAL_TABLET | Freq: Every day | ORAL | Status: AC

## 2013-01-14 MED ORDER — SODIUM CHLORIDE 0.9 % IV SOLN
3.0000 mg/kg | Freq: Once | INTRAVENOUS | Status: AC
  Administered 2013-01-14: 257 mg via INTRAVENOUS
  Filled 2013-01-14: qty 25.7

## 2013-01-14 MED ORDER — SODIUM CHLORIDE 0.9 % IV SOLN
Freq: Once | INTRAVENOUS | Status: AC
  Administered 2013-01-14: 12:00:00 via INTRAVENOUS

## 2013-01-14 NOTE — Patient Instructions (Signed)
We will resume immunotherapy today as scheduled.  Followup visit in 2 weeks.

## 2013-01-14 NOTE — Telephone Encounter (Signed)
Called in remeron to [address]  in [place], [place]. Pt notified.

## 2013-01-14 NOTE — Progress Notes (Signed)
01/14/13 @ 11:40 am, BMS [id number], Cycle 3 (delayed due to RT):  Mr. Rother, accompanied by his wife, into the [facility] for labs, to see Dr. Arbutus Ped and to receive treatment with nivolumab.  Mr. Kawahara completed Radiation Therapy on 01/07/13 and therefore can resume treatment today.  His labs are all appropriate to proceed with treatment.  Mr. Petta does report feeling somewhat depressed since the recent diagnosis of metastasis to his eyes.  Dr. Arbutus Ped will prescribe Remeron for the depression.(Depression grade 1).  He did have fever 8 days after his last treatment with a maximum of 102 degrees F. (grade 1). He utilized Tyenol and ibuprofen intermittently with relief.  He also reports being more fatigued than usual for the last two weeks or so.  He is napping in the afternoon, but can still perform all ADLs and continues to go to work intermittently, since his vision has improved some (fatigue grade 1).   He said that he has been have some pain in his neck that is intermittent but very sharp when it occurs.  He spoke with Dr. Oran Rein at [facility] and she ordered an MRI of the cervical and thoracic spine on 01/09/13.  He said the pain is better, but Dr. Arbutus Ped encouraged him to keep the appointment for tomorrow at [facility] for the MRI.  01/21/13 @ 8:30 am:  Received an e-mail from Mr. Noga wife.  She reports that he has some pressure on his spinal cord in the cervical area and has started RT to the area on 01/19/13.  He will not be able to receive treatment on 01/28/13.  Those appointments were canceled.  He will complete RT if all goes as planned on December 7th per his wife.  We will plan to see him here again on 02/10/13 when he will have a chest CT and see Dr. Arbutus Ped.  At that time we will make a determination whether he can or should continue to receive nivolumab.  02/04/13 12:50 pm:  Contacted by Mrs. Johannes who reported that Mr. Tapanes completed his radiation therapy to C2-7 on 02/03/13.  She also gave me the  details of his decadron taper.  He started decadron 4mg  bid on 01/16/13 x 5 days; decreased to 4mg  daily on 01/21/13 x 4 days; decreased to 2 mg daily on 01/25/13 x 3 days; decreased to 1 mg daily on 01/28/13 x 2 days with 01/29/13 as he last dose.

## 2013-01-14 NOTE — Progress Notes (Signed)
[facility] [phone]   [phone]  OFFICE PROGRESS NOTE  [name], MD [address] [facility] [place] [address]  DIAGNOSIS: metastatic non-small cell lung cancer, adenocarcinoma with positive EGFR mutation in exon 21 (L858R) diagnosed in February of 2014. Recent biopsy showed negative resistant mutation T790M.  PRIOR THERAPY: 1) Palliative radiotherapy to the right acetabulum and pelvic lesions for a total of 10 fractions completed in April 2014. 2) Treatment with Tarceva 50 mg by mouth daily in April 2014 for around 6 weeks. Restaging scans showed poor response to the treatment. 3) Tarceva dose was increased to 75 mg by mouth daily but he was also started on treatment with carboplatin, Alimta with Neulasta support. First dose was given on 07/08/2012. He completed 4 cycles of this combination of targets therapy and chemotherapy. Chemotherapy was discontinued secondary to disease progression. 4) Tarceva 150 mg by mouth daily for one month's discontinued on 11/24/2012 secondary to with significant disease progression in the right pleural space.  5) rebiopsy for evaluation of the resistant mutation T790M was negative. 6) palliative radiotherapy to the choroidal lesions in the eyes bilaterally completed 01/08/2003 to  CURRENT THERAPY: Immunotherapy clinical trial BMS [place] [id number] for treatment with Nivolumab 3 mg/kg every 2 weeks status post 2 cycles.   INTERVAL HISTORY: Cameron Berry 62 y.o. male returns to the clinic today for follow up visit accompanied by his wife. The patient is feeling fine today with no specific complaints. After cycle #2 of his chemotherapy the patient and complained of sudden onset of visual changes in the right eye with occasional dizzy spells and headache. 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  There was also macular involvement that was extensive in the right eye there was a smaller lesion superior to the macula OS. The patient was started on palliative radiotherapy at [facility] completed on 01/07/2013. He is feeling much better today with improvement in his vision. The patient has no complaints today except for fatigue and mild depression. He denied having any significant chest pain, shortness of breath, cough or hemoptysis. He denied having any weight loss or night sweats. He has no nausea or vomiting. He denied having any significant fever or chills. He is here today to resume his systemic treatment with Nivolumab.  ALLERGIES:  has No Known Allergies.  MEDICATIONS:  Current Outpatient Prescriptions  Medication Sig Dispense Refill  . acetaminophen (TYLENOL) 500 MG tablet Take 1 tablet by mouth 3 (three) times daily. Take 1,000 mg by mouth 3 (three) times daily as needed for Pain.      Marland Kitchen ALPRAZolam (XANAX) 0.25 MG tablet Take 1 tablet by mouth 3 (three) times daily as needed. Take 1 tablet (0.25 mg total) by mouth 3 (three) times daily as needed.      . Calcium Carbonate-Vitamin D 600-400 MG-UNIT per tablet Take by mouth.      . denosumab (XGEVA) 120 MG/1.7ML SOLN injection Inject 120 mg into the skin every 28 (twenty-eight) days. Inject 120 mg subcutaneously every 28 (twenty-eight) days.      Marland Kitchen docusate sodium (STOOL SOFTENER) 100 MG capsule Take 100 mg by mouth 2 (two) times daily as needed. Take 100 mg by mouth 2 (two) times daily as needed for Constipation.      . famotidine (PEPCID) 20 MG tablet Take 20 mg  by mouth 2 (two) times daily. Take 20 mg by mouth 2 (two) times daily.      Marland Kitchen ibuprofen (ADVIL,MOTRIN) 200 MG tablet Take 200 mg by mouth daily. Take 200 mg by mouth every 8 (eight) hours as needed.      Marland Kitchen ibuprofen (ADVIL,MOTRIN) 200 MG tablet Take 200 mg by mouth every 8 (eight) hours as needed.      . loperamide (IMODIUM A-D) 2 MG tablet Take 2 mg by mouth 4 (four) times  daily as needed. Take 2 mg by mouth 4 (four) times daily as needed for Diarrhea.      Marland Kitchen LORazepam (ATIVAN) 0.5 MG tablet Take 0.5 mg by mouth every 8 (eight) hours as needed. Take 0.5 mg by mouth every 8 (eight) hours as needed. For anxiety      . metoprolol succinate (TOPROL-XL) 50 MG 24 hr tablet Take 50 mg by mouth 2 (two) times daily. Take 50 mg by mouth 2 (two) times daily.      . ondansetron (ZOFRAN) 8 MG tablet 8 mg 2 (two) times daily as needed.      . ondansetron (ZOFRAN) 8 MG tablet 8 mg 2 (two) times daily as needed.      . ondansetron (ZOFRAN-ODT) 8 MG disintegrating tablet Take 8 mg by mouth every 12 (twelve) hours as needed. Take 8 mg by mouth every 12 (twelve) hours as needed for Nausea.      . ondansetron (ZOFRAN-ODT) 8 MG disintegrating tablet Take 8 mg by mouth.      . prochlorperazine (COMPAZINE) 10 MG tablet Take 10 mg by mouth.      . promethazine (PHENERGAN) 12.5 MG tablet Take 12.5 mg by mouth every 8 (eight) hours as needed.      . zolpidem (AMBIEN) 10 MG tablet Take 10 mg by mouth at bedtime as needed. Take 1 tablet (10 mg total) by mouth nightly.      Marland Kitchen amLODipine (NORVASC) 10 MG tablet Take 10 mg by mouth.      . docusate sodium (STOOL SOFTENER) 100 MG capsule Take 100 mg by mouth 2 (two) times daily as needed.      . loperamide (IMODIUM A-D) 2 MG tablet Take 2 mg by mouth 4 (four) times daily as needed.       No current facility-administered medications for this visit.    SURGICAL HISTORY: No past surgical history on file.  REVIEW OF SYSTEMS:  Constitutional: negative Eyes: negative Ears, nose, mouth, throat, and face: negative Respiratory: negative Cardiovascular: negative Gastrointestinal: negative Genitourinary:negative Integument/breast: negative Hematologic/lymphatic: negative Musculoskeletal:negative Neurological: negative Behavioral/Psych: negative Endocrine: negative Allergic/Immunologic: negative   PHYSICAL EXAMINATION: General appearance: alert,  cooperative and no distress Head: Normocephalic, without obvious abnormality, atraumatic Neck: no adenopathy, no JVD, supple, symmetrical, trachea midline and thyroid not enlarged, symmetric, no tenderness/mass/nodules Lymph nodes: Cervical, supraclavicular, and axillary nodes normal. Resp: diminished breath sounds RLL and dullness to percussion RLL Back: symmetric, no curvature. ROM normal. No CVA tenderness. Cardio: regular rate and rhythm, S1, S2 normal, no murmur, click, rub or gallop GI: soft, non-tender; bowel sounds normal; no masses,  no organomegaly Extremities: extremities normal, atraumatic, no cyanosis or edema Neurologic: Alert and oriented X 3, normal strength and tone. Normal symmetric reflexes. Normal coordination and gait  ECOG PERFORMANCE STATUS: 0 - Asymptomatic  Blood pressure 152/84, pulse 80, temperature 98.2 F (36.8 C), temperature source Oral, resp. rate 19, height 6' (1.829 m), weight 179 lb 4.8 oz (81.33 kg), SpO2 97.00%.  LABORATORY  DATA: Lab Results  Component Value Date   WBC 9.5 01/14/2013      Chemistry      Component Value Date/Time   NA 138 12/16/2012 0851      Component Value Date/Time   CALCIUM 9.1 12/16/2012 0851       RADIOGRAPHIC STUDIES:   ASSESSMENT AND PLAN: This is a very pleasant 62 years old white male with metastatic non-small cell lung cancer, adenocarcinoma with positive EGFR mutation in exon 21 (L858R) with no evidence for distant mutation T790M but unfortunately did not respond to Tarceva as expected and he continues to have evidence for disease progression especially at the right pleural based masses. He also felt systemic chemotherapy with carboplatin and Alimta. 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 He is feeling much better and ready to resume his treatment with immunotherapy. We will proceed with cycle #3 today as scheduled. For depression, I will start the  patient on Remeron 30 mg by mouth each bedtime. The patient would come back for followup visit in 2 weeks with the next cycle of his treatment. For the metastatic bone disease, he will continue on treatment with Xgeva. The patient voices understanding of current disease status and treatment options and is in agreement with the current care plan.  All questions were answered. The patient knows to call the clinic with any problems, questions or concerns. We can certainly see the patient much sooner if necessary.  I spent 20 minutes counseling the patient face to face. The total time spent in the appointment was 25 minutes.

## 2013-01-14 NOTE — Patient Instructions (Signed)
[facility] Discharge Instructions for Patients Receiving Chemotherapy  Today you received the following chemotherapy agents: Nivolumab  To help prevent nausea and vomiting after your treatment, we encourage you to take your nausea medication as prescribed.   If you develop nausea and vomiting that is not controlled by your nausea medication, call the clinic.   BELOW ARE SYMPTOMS THAT SHOULD BE REPORTED IMMEDIATELY:  *FEVER GREATER THAN 100.5 F  *CHILLS WITH OR WITHOUT FEVER  NAUSEA AND VOMITING THAT IS NOT CONTROLLED WITH YOUR NAUSEA MEDICATION  *UNUSUAL SHORTNESS OF BREATH  *UNUSUAL BRUISING OR BLEEDING  TENDERNESS IN MOUTH AND THROAT WITH OR WITHOUT PRESENCE OF ULCERS  *URINARY PROBLEMS  *BOWEL PROBLEMS  UNUSUAL RASH Items with * indicate a potential emergency and should be followed up as soon as possible.  Feel free to call the clinic you have any questions or concerns. The clinic phone number is [phone].    

## 2013-01-28 ENCOUNTER — Ambulatory Visit: Payer: BC Managed Care – PPO | Admitting: Physician Assistant

## 2013-01-28 ENCOUNTER — Ambulatory Visit: Payer: BC Managed Care – PPO

## 2013-01-28 ENCOUNTER — Other Ambulatory Visit: Payer: BC Managed Care – PPO | Admitting: Lab

## 2013-02-10 ENCOUNTER — Encounter (HOSPITAL_COMMUNITY): Payer: Self-pay

## 2013-02-10 ENCOUNTER — Telehealth: Payer: Self-pay | Admitting: *Deleted

## 2013-02-10 ENCOUNTER — Ambulatory Visit (HOSPITAL_COMMUNITY): Payer: BC Managed Care – PPO

## 2013-02-10 ENCOUNTER — Ambulatory Visit: Payer: BC Managed Care – PPO

## 2013-02-10 ENCOUNTER — Ambulatory Visit (HOSPITAL_COMMUNITY)
Admission: RE | Admit: 2013-02-10 | Discharge: 2013-02-10 | Disposition: A | Discharge status: Home / Self Care | Payer: BC Managed Care – PPO | Source: Ambulatory Visit | Attending: Internal Medicine | Admitting: Internal Medicine

## 2013-02-10 ENCOUNTER — Other Ambulatory Visit (HOSPITAL_BASED_OUTPATIENT_CLINIC_OR_DEPARTMENT_OTHER): Payer: BC Managed Care – PPO

## 2013-02-10 ENCOUNTER — Ambulatory Visit (HOSPITAL_BASED_OUTPATIENT_CLINIC_OR_DEPARTMENT_OTHER): Payer: BC Managed Care – PPO | Admitting: Internal Medicine

## 2013-02-10 ENCOUNTER — Encounter: Payer: Self-pay | Admitting: *Deleted

## 2013-02-10 ENCOUNTER — Encounter: Payer: Self-pay | Admitting: Internal Medicine

## 2013-02-10 VITALS — BP 127/76 | HR 108 | Temp 98.3°F | Resp 18 | Ht 72.0 in | Wt 172.5 lb

## 2013-02-10 DIAGNOSIS — N200 Calculus of kidney: Secondary | ICD-10-CM | POA: Insufficient documentation

## 2013-02-10 DIAGNOSIS — R918 Other nonspecific abnormal finding of lung field: Secondary | ICD-10-CM | POA: Insufficient documentation

## 2013-02-10 DIAGNOSIS — J90 Pleural effusion, not elsewhere classified: Secondary | ICD-10-CM | POA: Insufficient documentation

## 2013-02-10 DIAGNOSIS — C3410 Malignant neoplasm of upper lobe, unspecified bronchus or lung: Secondary | ICD-10-CM

## 2013-02-10 DIAGNOSIS — Z9221 Personal history of antineoplastic chemotherapy: Secondary | ICD-10-CM | POA: Insufficient documentation

## 2013-02-10 DIAGNOSIS — C3490 Malignant neoplasm of unspecified part of unspecified bronchus or lung: Secondary | ICD-10-CM

## 2013-02-10 DIAGNOSIS — C7951 Secondary malignant neoplasm of bone: Secondary | ICD-10-CM | POA: Insufficient documentation

## 2013-02-10 DIAGNOSIS — Z923 Personal history of irradiation: Secondary | ICD-10-CM | POA: Insufficient documentation

## 2013-02-10 DIAGNOSIS — R599 Enlarged lymph nodes, unspecified: Secondary | ICD-10-CM | POA: Insufficient documentation

## 2013-02-10 DIAGNOSIS — F329 Major depressive disorder, single episode, unspecified: Secondary | ICD-10-CM

## 2013-02-10 DIAGNOSIS — C782 Secondary malignant neoplasm of pleura: Secondary | ICD-10-CM

## 2013-02-10 DIAGNOSIS — Z79899 Other long term (current) drug therapy: Secondary | ICD-10-CM | POA: Insufficient documentation

## 2013-02-10 LAB — COMPREHENSIVE METABOLIC PANEL (CC13)
ALT: 13 U/L (ref 0–55)
AST: 16 U/L (ref 5–34)
Albumin: 2.6 g/dL — ABNORMAL LOW (ref 3.5–5.0)
Alkaline Phosphatase: 245 U/L — ABNORMAL HIGH (ref 40–150)
BUN: 19.9 mg/dL (ref 7.0–26.0)
Calcium: 8.2 mg/dL — ABNORMAL LOW (ref 8.4–10.4)
Chloride: 101 mEq/L (ref 98–109)
Potassium: 4.4 mEq/L (ref 3.5–5.1)
Sodium: 132 mEq/L — ABNORMAL LOW (ref 136–145)
Total Protein: 6.2 g/dL — ABNORMAL LOW (ref 6.4–8.3)

## 2013-02-10 LAB — CBC WITH DIFFERENTIAL/PLATELET
BASO%: 1.3 % (ref 0.0–2.0)
EOS%: 3.1 % (ref 0.0–7.0)
Eosinophils Absolute: 0.1 10*3/uL (ref 0.0–0.5)
HCT: 31.8 % — ABNORMAL LOW (ref 38.4–49.9)
MCH: 25.1 pg — ABNORMAL LOW (ref 27.2–33.4)
MCHC: 33.3 g/dL (ref 32.0–36.0)
MONO#: 0.7 10*3/uL (ref 0.1–0.9)
NEUT#: 2.6 10*3/uL (ref 1.5–6.5)
NEUT%: 65 % (ref 39.0–75.0)
RBC: 4.23 10*6/uL (ref 4.20–5.82)
RDW: 17.9 % — ABNORMAL HIGH (ref 11.0–14.6)
WBC: 3.9 10*3/uL — ABNORMAL LOW (ref 4.0–10.3)
lymph#: 0.5 10*3/uL — ABNORMAL LOW (ref 0.9–3.3)
nRBC: 0 % (ref 0–0)

## 2013-02-10 LAB — TECHNOLOGIST REVIEW

## 2013-02-10 LAB — TSH CHCC: TSH: 0.912 m(IU)/L (ref 0.320–4.118)

## 2013-02-10 MED ORDER — IOHEXOL 300 MG/ML  SOLN
80.0000 mL | Freq: Once | INTRAMUSCULAR | Status: AC | PRN
  Administered 2013-02-10: 80 mL via INTRAVENOUS

## 2013-02-10 NOTE — Progress Notes (Signed)
[facility] [phone]   [phone]  OFFICE PROGRESS NOTE  [name], MD [address] [facility] [place] [address]  DIAGNOSIS: metastatic non-small cell lung cancer, adenocarcinoma with positive EGFR mutation in exon 21 (L858R) diagnosed in February of 2014. Recent biopsy showed negative resistant mutation T790M.  PRIOR THERAPY: 1) Palliative radiotherapy to the right acetabulum and pelvic lesions for a total of 10 fractions completed in April 2014. 2) Treatment with Tarceva 50 mg by mouth daily in April 2014 for around 6 weeks. Restaging scans showed poor response to the treatment. 3) Tarceva dose was increased to 75 mg by mouth daily but he was also started on treatment with carboplatin, Alimta with Neulasta support. First dose was given on 07/08/2012. He completed 4 cycles of this combination of targets therapy and chemotherapy. Chemotherapy was discontinued secondary to disease progression. 4) Tarceva 150 mg by mouth daily for one month's discontinued on 11/24/2012 secondary to with significant disease progression in the right pleural space.  5) rebiopsy for evaluation of the resistant mutation T790M was negative. 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   INTERVAL HISTORY: Cameron Berry 62 y.o. male returns to the clinic today for follow up visit accompanied by his wife. The patient is feeling fine today with no specific complaints except for increasing fatigue and weakness. He came to the clinic on a wheelchair today. He recently complained of increasing dizziness and had repeat MRI of the brain which showed stable disease with no new findings. He was pancytopenic and received 2 units of PRBCs transfusion on 02/06/2013. He  also completed palliative radiotherapy to a new epidural disease causing pain in the C-spine. He has evidence of nonsymptomatic epidural disease in the T-spine. His palliative radiotherapy was done at [facility] in [place], [place]. His treatment it is coordinated by Dr. Jerre Simon. He denied having any significant chest pain, shortness of breath, cough or hemoptysis. He has no nausea or vomiting. He denied having any significant fever or chills.  The patient had repeat CT scan of the chest performed earlier today and he is here for evaluation and discussion of his scan results.  ALLERGIES:  has No Known Allergies.  MEDICATIONS:  Current Outpatient Prescriptions  Medication Sig Dispense Refill  . acetaminophen (TYLENOL) 500 MG tablet Take 1 tablet by mouth 3 (three) times daily. Take 1,000 mg by mouth 3 (three) times daily as needed for Pain.      Marland Kitchen ALPRAZolam (XANAX) 0.25 MG tablet Take 1 tablet by mouth 3 (three) times daily as needed. Take 1 tablet (0.25 mg total) by mouth 3 (three) times daily as needed.      Marland Kitchen amLODipine (NORVASC) 10 MG tablet Take 10 mg by mouth.      . Calcium Carbonate-Vitamin D 600-400 MG-UNIT per tablet Take by mouth.      . denosumab (XGEVA) 120 MG/1.7ML SOLN injection Inject 120 mg into the skin every 28 (twenty-eight) days. Inject 120 mg subcutaneously every 28 (twenty-eight) days.      Marland Kitchen docusate sodium (STOOL SOFTENER) 100 MG capsule Take 100 mg by mouth 2 (two) times daily as needed.      . famotidine (PEPCID) 20 MG tablet Take 20 mg by mouth 2 (two) times daily.  Take 20 mg by mouth 2 (two) times daily.      Marland Kitchen ibuprofen (ADVIL,MOTRIN) 200 MG tablet Take 200 mg by mouth daily. Take 200 mg by mouth every 8 (eight) hours as needed.      . loperamide (IMODIUM A-D) 2 MG tablet Take 2 mg by mouth 4 (four) times daily as needed.      Marland Kitchen LORazepam (ATIVAN) 0.5 MG tablet Take 0.5 mg by mouth every 8 (eight) hours as needed. Take 0.5 mg by mouth every 8  (eight) hours as needed. For anxiety      . metoprolol succinate (TOPROL-XL) 50 MG 24 hr tablet Take 50 mg by mouth 2 (two) times daily. Take 50 mg by mouth 2 (two) times daily.      . mirtazapine (REMERON) 30 MG tablet Take 1 tablet (30 mg total) by mouth at bedtime.  30 tablet  2  . ondansetron (ZOFRAN) 8 MG tablet 8 mg 2 (two) times daily as needed.      . ondansetron (ZOFRAN-ODT) 8 MG disintegrating tablet Take 8 mg by mouth every 12 (twelve) hours as needed. Take 8 mg by mouth every 12 (twelve) hours as needed for Nausea.      . ondansetron (ZOFRAN-ODT) 8 MG disintegrating tablet Take 8 mg by mouth.      . oxyCODONE (OXY IR/ROXICODONE) 5 MG immediate release tablet Take 5 mg by mouth every 4 (four) hours as needed for severe pain (1-3 tabs Q3 hours as needed).      . prochlorperazine (COMPAZINE) 10 MG tablet Take 10 mg by mouth.      . sennosides-docusate sodium (SENOKOT-S) 8.6-50 MG tablet Take 2 tablets by mouth daily.      Marland Kitchen zolpidem (AMBIEN) 10 MG tablet Take 10 mg by mouth at bedtime as needed. Take 1 tablet (10 mg total) by mouth nightly.       No current facility-administered medications for this visit.    REVIEW OF SYSTEMS:  Constitutional: negative Eyes: negative Ears, nose, mouth, throat, and face: negative Respiratory: negative Cardiovascular: negative Gastrointestinal: negative Genitourinary:negative Integument/breast: negative Hematologic/lymphatic: negative Musculoskeletal:negative Neurological: negative Behavioral/Psych: negative Endocrine: negative Allergic/Immunologic: negative   PHYSICAL EXAMINATION: General appearance: alert, cooperative and no distress Head: Normocephalic, without obvious abnormality, atraumatic Neck: no adenopathy, no JVD, supple, symmetrical, trachea midline and thyroid not enlarged, symmetric, no tenderness/mass/nodules Lymph nodes: Cervical, supraclavicular, and axillary nodes normal. Resp: diminished breath sounds RLL and dullness to  percussion RLL Back: symmetric, no curvature. ROM normal. No CVA tenderness. Cardio: regular rate and rhythm, S1, S2 normal, no murmur, click, rub or gallop GI: soft, non-tender; bowel sounds normal; no masses,  no organomegaly Extremities: extremities normal, atraumatic, no cyanosis or edema Neurologic: Alert and oriented X 3, normal strength and tone. Normal symmetric reflexes. Normal coordination and gait  ECOG PERFORMANCE STATUS: 0 - Asymptomatic  Blood pressure 127/76, pulse 108, temperature 98.3 F (36.8 C), temperature source Oral, resp. rate 18, height 6' (1.829 m), weight 172 lb 8 oz (78.245 kg), SpO2 98.00%.  LABORATORY DATA: Lab Results  Component Value Date   WBC 3.9* 02/10/2013      Chemistry      Component Value Date/Time   NA 132* 02/10/2013 1326      Component Value Date/Time   CALCIUM 8.2* 02/10/2013 1326       RADIOGRAPHIC STUDIES: Ct Chest W Contrast  02/10/2013   CLINICAL DATA:  Metastatic lung cancer diagnosed 04/2012, XRT/tarceva complete, research drug ongoing.  EXAM: CT CHEST WITH  CONTRAST  TECHNIQUE: Multidetector CT imaging of the chest was performed during intravenous contrast administration.  CONTRAST:  80mL OMNIPAQUE IOHEXOL 300 MG/ML  SOLN  COMPARISON:  11/28/2012  RECIST 1.1  Target Lesions:  1. Pleural-based tumor within the medial aspect of the right lower lobe has essentially resolved/is not measurable. 2. However, inferior to the prior target lesion along the medial right lower lobe, residual tumor is present measuring up to 27 mm (series 2/image 38). 3. Anterior medial right upper lobe pleural tumor extending into the right chest wall has resolved. Non-target Lesions:  1. Pleural tumor overlying the anterior right upper lobe has resolved. 2. Loculated pleural fluid within the right lung base - present, although less loculated in appearance. 3. Interstitial thickening within the right middle and lower lobes, suspicious for lymphangitic tumor -  present within the right lower low (series 5/ image 45), improved. 4. Sclerotic lesion within the T12 vertebral body - present (series 2/image 60).  FINDINGS: Pleural-based tumor in the right hemithorax is markedly improved. Residual 2.4 x 2.7 cm implant involving the medial right lower lobe (series 2/image 38), previously at least 4.8 x 3.7 cm. Additional 2.1 x 3.7 cm pleural tumor at the medial right lung base (series 2/ image 50), previously extensively involving the entire pleural surface. Three additional nodular foci of residual pleural tumor at the lateral right lung base, measuring 10-15 mm (series 2/image 40), significantly improved.  Small right pleural effusion, stable versus minimally decreased.  Interlobular septal thickening, predominantly involving the right lower lobe (series 5/ image 45), improved. This appearance remains suspicious for lymphangitic spread of tumor.  Scattered small pulmonary nodules in the left lung, new. For example mild nodularity in the posterior left upper lobe measuring up to 4 mm (series 5/ image 12). Small lingular nodules measuring up to 8 mm (series 5/ images 39 and 42). Additional scattered. 4 mm nodules along the left fissure (series 5/ images 28, 37, and 38). 3 mm subpleural nodule in the left lower lobe (series 5/ image 45).  No pneumothorax.  Visualized thyroid is unremarkable.  The heart is normal in size.  No pericardial effusion.  Thoracic lymphadenopathy, much of which is new, including:  --13 mm short axis right axillary node (series 2/image 16)  --10 mm short axis left axillary node (series 2/image 17)  --10 mm short axis prevascular node (series 2/image 22)  --11 mm short axis right paratracheal node (series 2/image 22)  --22 mm short axis right hilar node (series 2/image 29)  Visualized upper abdomen is notable for multiple new splenic lesions, measuring up to 2.5 cm (series 2/ image 72), incompletely visualized but suspicious for metastases. 13 mm short axis  node medial to the right kidney (series 2/image 57), previously 4 mm. 8 x 11 mm peritoneal implant anterior to the left liver (series 2/ image 71), new.  Additionally noted are bilateral left renal calculi and multiple bilateral renal lesions, incompletely visualized/characterized.  Multifocal sclerotic osseous metastases, predominantly in the lower thoracic spine involving T10-12 (sagittal image 54), much of which is new.   IMPRESSION: Mixed response with overall progression of disease.  Pleural-based tumor in the right hemithorax is markedly improved, with residual tumor burden as described above. 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  New scattered nodules in the left lung, measuring up to 8 mm. Progression of thoracic lymphadenopathy, measuring up to 2.2 cm, as described above. Progression of sclerotic osseous metastases predominantly involving the thoracic spine.  New splenic metastases measuring up to 2.5 cm, right retroperitoneal nodal metastasis, and suspected peritoneal metastasis anterior to the left liver, incompletely visualized.  RECIST 1.1 measurements as above.   Electronically Signed   By: Charline Bills M.D.   On: 02/10/2013 15:18    ASSESSMENT AND PLAN: This is a very pleasant 62 years old white male with metastatic non-small cell lung cancer, adenocarcinoma with positive EGFR mutation in exon 21 (L858R) with no evidence for distant mutation T790M but unfortunately did not respond to Tarceva as expected and he continues to have evidence for disease progression especially at the right pleural based masses. He also felt systemic chemotherapy with carboplatin and Alimta. 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 He also has evidence for leptomeningeal disease and completed radiotherapy treatment to the cervical spine area. His recent CT scan of the chest showed mixed  response with almost complete resolution of the pleural based tumor in the right hemithorax but unfortunately the patient has new scattered nodules in the left lung and progressive thoracic lymphadenopathy in addition to new splenic lesion and bone metastasis. I reviewed the images with the patient but the final report was not available at the time of the visit. His platelets count are today and this could be secondary to recent radiotherapy plus minus over the counter ibuprofen. I advised the patient to discontinue ibuprofen at this point. I recommended for the patient to come back next week for evaluation before resuming the immunotherapy with Nivolumab if he consents to proceed with treatment after disease progression. Other treatment options for this patient would be a combination of Afatinib and cetuximab. For depression, he will continue on Remeron 30 mg by mouth each bedtime. For the metastatic bone disease, he will continue on treatment with Xgeva. The patient voices understanding of current disease status and treatment options and is in agreement with the current care plan.  All questions were answered. The patient knows to call the clinic with any problems, questions or concerns. We can certainly see the patient much sooner if necessary.  I spent 20 minutes counseling the patient face to face. The total time spent in the appointment was 25 minutes.

## 2013-02-10 NOTE — Telephone Encounter (Signed)
appts made per 12/16 POF Email to MW to change Tx AVS and CAl given shh

## 2013-02-10 NOTE — Progress Notes (Signed)
02/10/13 @ 3:30 pm, BMS [id number], Cycle 4 (Delayed)Supplemental Visit:  Mr. Strozier into the [facility] with his wife for labs, to complete the LCSS and EQ-5D-3L PROs, have labs and to see Dr. Arbutus Ped.  He had a chest CT just prior to the visit.  He has completed RT to C 2-7 a week ago, but is having increased fatigue, grade 2.  His platelet count was found to be 30,000 today (grade 3).  Other labs were acceptable.  His reported two biggest complaints are bone pain (grade 2) and fatigue.  He does have some balance issues which he relates to visual disturbance in his right eye.  The vision in his left eye has returned to baseline since being treated with RT, but his left eye has wavy vision.  Dr. Arbutus Ped did not want to pursue treatment today due to the thrombocytopenia; therefore, will forward all pertinent information, including the mixed response per CT scan to the medical monitor via an [facility] communication regarding further treatment on protocol.  Dr. Arbutus Ped advise discontinuation of all ibuprofen.  Mrs. Boehringer reports that patient has not taken any since 02/04/13. 02/11/13:  Received an ePiP response that he could continue to receive treatment with nivolumab after signing consent for treatment beyond progression.  02/17/13 @ 12:15, Cycle 4, Re-Consent and Treatment:   Mr. Mayabb into the [facility] with his wife for labs, to complete the LCSS and EQ-5D-3L PROs, have labs and to see Dr. Arbutus Ped. His platelets have recovered to grade 2 with no symptoms whatsoever of the thrombocytopenia.  His energy level has improved, but fatigue and pain are his biggest complaints.  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  He is utilizing OxyIR with good management.  After much discussion, Mr. Encarnacion decided to consent to treatment beyond progression.  We went through the consent and read it together.  He and his wife clearly understand the risks and benefits, and all of their questions were  answered thoroughly by Dr. Arbutus Ped.  He signed the consent and was given a copy of it for his records. Today Mr. Hansen reported that he is having problems with constipation that started a few days after starting OxyIR for pain.  He is utilizing Senokot S for the constipation with mixed results.  He was advised to increase his dose if needed. He reports no diarrhea since starting narcotics, at the latest.  He also denied any fever and chills since 8 days after his Cycle 2 treatment.    Spoke with Santiago Glad, RN in the infusion area about treatment today and provided her with a sign for the pump.  Emphasized the use of the 0.22 micron filter.

## 2013-02-10 NOTE — Telephone Encounter (Signed)
Per staff message and POF I have scheduled appts.  JMW  

## 2013-02-10 NOTE — Patient Instructions (Signed)
Followup visit in one week for evaluation before resuming treatment with Nivolumab.

## 2013-02-17 ENCOUNTER — Other Ambulatory Visit (HOSPITAL_BASED_OUTPATIENT_CLINIC_OR_DEPARTMENT_OTHER): Payer: BC Managed Care – PPO

## 2013-02-17 ENCOUNTER — Ambulatory Visit (HOSPITAL_BASED_OUTPATIENT_CLINIC_OR_DEPARTMENT_OTHER): Payer: BC Managed Care – PPO | Admitting: Internal Medicine

## 2013-02-17 ENCOUNTER — Ambulatory Visit (HOSPITAL_BASED_OUTPATIENT_CLINIC_OR_DEPARTMENT_OTHER): Payer: BC Managed Care – PPO

## 2013-02-17 ENCOUNTER — Encounter: Payer: Self-pay | Admitting: Internal Medicine

## 2013-02-17 VITALS — BP 143/71 | HR 105 | Temp 97.9°F | Resp 18 | Ht 72.0 in | Wt 169.1 lb

## 2013-02-17 DIAGNOSIS — C782 Secondary malignant neoplasm of pleura: Secondary | ICD-10-CM

## 2013-02-17 DIAGNOSIS — C7951 Secondary malignant neoplasm of bone: Secondary | ICD-10-CM

## 2013-02-17 DIAGNOSIS — R5381 Other malaise: Secondary | ICD-10-CM

## 2013-02-17 DIAGNOSIS — C3490 Malignant neoplasm of unspecified part of unspecified bronchus or lung: Secondary | ICD-10-CM

## 2013-02-17 DIAGNOSIS — C3410 Malignant neoplasm of upper lobe, unspecified bronchus or lung: Secondary | ICD-10-CM

## 2013-02-17 DIAGNOSIS — Z5111 Encounter for antineoplastic chemotherapy: Secondary | ICD-10-CM

## 2013-02-17 DIAGNOSIS — F329 Major depressive disorder, single episode, unspecified: Secondary | ICD-10-CM

## 2013-02-17 LAB — COMPREHENSIVE METABOLIC PANEL (CC13)
ALT: 30 U/L (ref 0–55)
Albumin: 2.4 g/dL — ABNORMAL LOW (ref 3.5–5.0)
Alkaline Phosphatase: 217 U/L — ABNORMAL HIGH (ref 40–150)
CO2: 23 mEq/L (ref 22–29)
Calcium: 8.2 mg/dL — ABNORMAL LOW (ref 8.4–10.4)
Chloride: 104 mEq/L (ref 98–109)
Glucose: 136 mg/dl (ref 70–140)
Potassium: 4.3 mEq/L (ref 3.5–5.1)
Sodium: 140 mEq/L (ref 136–145)
Total Protein: 6.4 g/dL (ref 6.4–8.3)

## 2013-02-17 LAB — CBC WITH DIFFERENTIAL/PLATELET
Eosinophils Absolute: 0.1 10*3/uL (ref 0.0–0.5)
HGB: 10 g/dL — ABNORMAL LOW (ref 13.0–17.1)
MCHC: 32.3 g/dL (ref 32.0–36.0)
MCV: 77.1 fL — ABNORMAL LOW (ref 79.3–98.0)
MONO#: 0.6 10*3/uL (ref 0.1–0.9)
MONO%: 9.3 % (ref 0.0–14.0)
NEUT#: 4.9 10*3/uL (ref 1.5–6.5)
RBC: 4.03 10*6/uL — ABNORMAL LOW (ref 4.20–5.82)
RDW: 20 % — ABNORMAL HIGH (ref 11.0–14.6)
WBC: 6.1 10*3/uL (ref 4.0–10.3)
lymph#: 0.4 10*3/uL — ABNORMAL LOW (ref 0.9–3.3)

## 2013-02-17 LAB — TSH CHCC: TSH: 1.664 m(IU)/L (ref 0.320–4.118)

## 2013-02-17 LAB — TECHNOLOGIST REVIEW

## 2013-02-17 LAB — PHOSPHORUS: Phosphorus: 2.9 mg/dL (ref 2.3–4.6)

## 2013-02-17 LAB — MAGNESIUM (CC13): Magnesium: 2.2 mg/dl (ref 1.5–2.5)

## 2013-02-17 LAB — LACTATE DEHYDROGENASE (CC13): LDH: 635 U/L — ABNORMAL HIGH (ref 125–245)

## 2013-02-17 MED ORDER — SODIUM CHLORIDE 0.9 % IV SOLN
3.0000 mg/kg | Freq: Once | INTRAVENOUS | Status: AC
  Administered 2013-02-17: 257 mg via INTRAVENOUS
  Filled 2013-02-17: qty 25.7

## 2013-02-17 MED ORDER — SODIUM CHLORIDE 0.9 % IV SOLN
Freq: Once | INTRAVENOUS | Status: AC
  Administered 2013-02-17: 13:00:00 via INTRAVENOUS

## 2013-02-17 NOTE — Progress Notes (Signed)
Per Inocencio Homes, Research RN, treatment parameters verified with Dr. Arbutus Ped and OK to treat patient with PLT 66.

## 2013-02-17 NOTE — Progress Notes (Signed)
[facility] [phone]   [phone]  OFFICE PROGRESS NOTE  Cameron Dykes, MD [address] [facility] [place] [address]  DIAGNOSIS: metastatic non-small cell lung cancer, adenocarcinoma with positive EGFR mutation in exon 21 (L858R) diagnosed in February of 2014. Recent biopsy showed negative resistant mutation T790M.  PRIOR THERAPY: 1) Palliative radiotherapy to the right acetabulum and pelvic lesions for a total of 10 fractions completed in April 2014. 2) Treatment with Tarceva 50 mg by mouth daily in April 2014 for around 6 weeks. Restaging scans showed poor response to the treatment. 3) Tarceva dose was increased to 75 mg by mouth daily but he was also started on treatment with carboplatin, Alimta with Neulasta support. First dose was given on 07/08/2012. He completed 4 cycles of this combination of targets therapy and chemotherapy. Chemotherapy was discontinued secondary to disease progression. 4) Tarceva 150 mg by mouth daily for one month's discontinued on 11/24/2012 secondary to with significant disease progression in the right pleural space.  5) rebiopsy for evaluation of the resistant mutation T790M was negative. 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   INTERVAL HISTORY: Cameron Berry 62 y.o. male returns to the clinic today for follow up visit accompanied by his wife. The patient is feeling better today but continues to have generalized weakness. He was able to walk to the clinic with no need for wheelchair today.  He denied having any significant chest pain, shortness of breath, cough or hemoptysis. He has no nausea or vomiting. He denied having any significant fever or chills. No significant weight loss or night  sweats. He had a recent CT scan of the chest which showed mixed response to his treatment with immunotherapy. He is here today for further evaluation and discussion of his treatment options. Platelet counts are still low but improved compared to last week.    ALLERGIES:  has No Known Allergies.  MEDICATIONS:  Current Outpatient Prescriptions  Medication Sig Dispense Refill  . ALPRAZolam (XANAX) 0.25 MG tablet Take 1 tablet by mouth 3 (three) times daily as needed. Take 1 tablet (0.25 mg total) by mouth 3 (three) times daily as needed.      Marland Kitchen amLODipine (NORVASC) 10 MG tablet Take 10 mg by mouth.      . Calcium Carbonate-Vitamin D 600-400 MG-UNIT per tablet Take by mouth.      . denosumab (XGEVA) 120 MG/1.7ML SOLN injection Inject 120 mg into the skin every 28 (twenty-eight) days. Inject 120 mg subcutaneously every 28 (twenty-eight) days.      Marland Kitchen docusate sodium (STOOL SOFTENER) 100 MG capsule Take 100 mg by mouth 2 (two) times daily as needed.      . famotidine (PEPCID) 20 MG tablet Take 20 mg by mouth 2 (two) times daily. Take 20 mg by mouth 2 (two) times daily.      Marland Kitchen LORazepam (ATIVAN) 0.5 MG tablet Take 0.5 mg by mouth every 8 (eight) hours as needed. Take 0.5 mg by mouth every 8 (eight) hours as needed. For anxiety      . metoprolol succinate (TOPROL-XL) 50 MG 24 hr tablet Take 50 mg by mouth 2 (two) times daily. Take 50 mg by mouth 2 (two) times daily.      . mirtazapine (REMERON) 30  MG tablet Take 1 tablet (30 mg total) by mouth at bedtime.  30 tablet  2  . mirtazapine (REMERON) 30 MG tablet 30 mg at bedtime as needed.      . ondansetron (ZOFRAN) 8 MG tablet 8 mg 2 (two) times daily as needed.      . ondansetron (ZOFRAN-ODT) 8 MG disintegrating tablet Take 8 mg by mouth.      . oxyCODONE (OXY IR/ROXICODONE) 5 MG immediate release tablet Take 5 mg by mouth every 4 (four) hours as needed for severe pain (1-3 tabs Q3 hours as needed).      . sennosides-docusate sodium (SENOKOT-S) 8.6-50 MG  tablet Take 2 tablets by mouth daily.      Marland Kitchen zolpidem (AMBIEN) 10 MG tablet Take 10 mg by mouth at bedtime as needed. Take 1 tablet (10 mg total) by mouth nightly.      Marland Kitchen acetaminophen (TYLENOL) 500 MG tablet Take 1 tablet by mouth 3 (three) times daily. Take 1,000 mg by mouth 3 (three) times daily as needed for Pain.      Marland Kitchen loperamide (IMODIUM A-D) 2 MG tablet Take 2 mg by mouth 4 (four) times daily as needed.      . prochlorperazine (COMPAZINE) 10 MG tablet Take 10 mg by mouth.       No current facility-administered medications for this visit.    REVIEW OF SYSTEMS:  Constitutional: negative Eyes: negative Ears, nose, mouth, throat, and face: negative Respiratory: negative Cardiovascular: negative Gastrointestinal: negative Genitourinary:negative Integument/breast: negative Hematologic/lymphatic: negative Musculoskeletal:negative Neurological: negative Behavioral/Psych: negative Endocrine: negative Allergic/Immunologic: negative   PHYSICAL EXAMINATION: General appearance: alert, cooperative and no distress Head: Normocephalic, without obvious abnormality, atraumatic Neck: no adenopathy, no JVD, supple, symmetrical, trachea midline and thyroid not enlarged, symmetric, no tenderness/mass/nodules Lymph nodes: Cervical, supraclavicular, and axillary nodes normal. Resp: diminished breath sounds RLL and dullness to percussion RLL Back: symmetric, no curvature. ROM normal. No CVA tenderness. Cardio: regular rate and rhythm, S1, S2 normal, no murmur, click, rub or gallop GI: soft, non-tender; bowel sounds normal; no masses,  no organomegaly Extremities: extremities normal, atraumatic, no cyanosis or edema Neurologic: Alert and oriented X 3, normal strength and tone. Normal symmetric reflexes. Normal coordination and gait  ECOG PERFORMANCE STATUS: 0 - Asymptomatic  Blood pressure 143/71, pulse 105, temperature 97.9 F (36.6 C), temperature source Oral, resp. rate 18, height 6' (1.829  m), weight 169 lb 1.6 oz (76.703 kg), SpO2 98.00%.  LABORATORY DATA: Lab Results  Component Value Date   WBC 6.1 02/17/2013      Chemistry      Component Value Date/Time   NA 140 02/17/2013 1026      Component Value Date/Time   CALCIUM 8.2* 02/17/2013 1026       RADIOGRAPHIC STUDIES: Ct Chest W Contrast  02/10/2013   CLINICAL DATA:  Metastatic lung cancer diagnosed 04/2012, XRT/tarceva complete, research drug ongoing.  EXAM: CT CHEST WITH CONTRAST  TECHNIQUE: Multidetector CT imaging of the chest was performed during intravenous contrast administration.  CONTRAST:  80mL OMNIPAQUE IOHEXOL 300 MG/ML  SOLN  COMPARISON:  11/28/2012  RECIST 1.1  Target Lesions:  1. Pleural-based tumor within the medial aspect of the right lower lobe has essentially resolved/is not measurable. 2. However, inferior to the prior target lesion along the medial right lower lobe, residual tumor is present measuring up to 27 mm (series 2/image 38). 3. Anterior medial right upper lobe pleural tumor extending into the right chest wall has resolved. Non-target Lesions:  1.  Pleural tumor overlying the anterior right upper lobe has resolved. 2. Loculated pleural fluid within the right lung base - present, although less loculated in appearance. 3. Interstitial thickening within the right middle and lower lobes, suspicious for lymphangitic tumor - present within the right lower low (series 5/ image 45), improved. 4. Sclerotic lesion within the T12 vertebral body - present (series 2/image 60).  FINDINGS: Pleural-based tumor in the right hemithorax is markedly improved. Residual 2.4 x 2.7 cm implant involving the medial right lower lobe (series 2/image 38), previously at least 4.8 x 3.7 cm. Additional 2.1 x 3.7 cm pleural tumor at the medial right lung base (series 2/ image 50), previously extensively involving the entire pleural surface. Three additional nodular foci of residual pleural tumor at the lateral right lung base,  measuring 10-15 mm (series 2/image 40), significantly improved.  Small right pleural effusion, stable versus minimally decreased.  Interlobular septal thickening, predominantly involving the right lower lobe (series 5/ image 45), improved. This appearance remains suspicious for lymphangitic spread of tumor.  Scattered small pulmonary nodules in the left lung, new. For example mild nodularity in the posterior left upper lobe measuring up to 4 mm (series 5/ image 12). Small lingular nodules measuring up to 8 mm (series 5/ images 39 and 42). Additional scattered. 4 mm nodules along the left fissure (series 5/ images 28, 37, and 38). 3 mm subpleural nodule in the left lower lobe (series 5/ image 45).  No pneumothorax.  Visualized thyroid is unremarkable.  The heart is normal in size.  No pericardial effusion.  Thoracic lymphadenopathy, much of which is new, including:  --13 mm short axis right axillary node (series 2/image 16)  --10 mm short axis left axillary node (series 2/image 17)  --10 mm short axis prevascular node (series 2/image 22)  --11 mm short axis right paratracheal node (series 2/image 22)  --22 mm short axis right hilar node (series 2/image 29)  Visualized upper abdomen is notable for multiple new splenic lesions, measuring up to 2.5 cm (series 2/ image 72), incompletely visualized but suspicious for metastases. 13 mm short axis node medial to the right kidney (series 2/image 57), previously 4 mm. 8 x 11 mm peritoneal implant anterior to the left liver (series 2/ image 71), new.  Additionally noted are bilateral left renal calculi and multiple bilateral renal lesions, incompletely visualized/characterized.  Multifocal sclerotic osseous metastases, predominantly in the lower thoracic spine involving T10-12 (sagittal image 54), much of which is new.   IMPRESSION: Mixed response with overall progression of disease.  Pleural-based tumor in the right hemithorax is markedly improved, with residual tumor  burden as described above. 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  New scattered nodules in the left lung, measuring up to 8 mm. Progression of thoracic lymphadenopathy, measuring up to 2.2 cm, as described above. Progression of sclerotic osseous metastases predominantly involving the thoracic spine.  New splenic metastases measuring up to 2.5 cm, right retroperitoneal nodal metastasis, and suspected peritoneal metastasis anterior to the left liver, incompletely visualized.  RECIST 1.1 measurements as above.   Electronically Signed   By: Charline Bills M.D.   On: 02/10/2013 15:18    ASSESSMENT AND PLAN: This is a very pleasant 62 years old white male with metastatic non-small cell lung cancer, adenocarcinoma with positive EGFR mutation in exon 21 (L858R) with no evidence for distant mutation T790M but unfortunately did not respond to Tarceva as expected and he continues to have evidence  for disease progression especially at the right pleural based masses. He also felt systemic chemotherapy with carboplatin and Alimta. 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 He also has evidence for leptomeningeal disease and completed radiotherapy treatment to the cervical spine area. His recent CT scan of the chest showed mixed response with almost complete resolution of the pleural based tumor in the right hemithorax but unfortunately the patient has new scattered nodules in the left lung and progressive thoracic lymphadenopathy in addition to new splenic lesion and bone metastasis. I reviewed the results of the scan with the patient and his wife again today. I have a lengthy discussion with him about his treatment options at this point including continuation of immunotherapy with Nivolumab with hopes that the disease progression is some form of pseudoprogression. If he continues on the  immunotherapy his next staging scan would be the end of January 2015 in around 5 weeks and this scan will give [place] a better idea about his current disease status. The patient was also given him the option of treatment with Afatinib and cetuximab and this will be coordinated by Dr. Jerre Simon. After lengthy discussion with her the options the patient decided to continue on immunotherapy and he signed a consent to continue treatment after the recent disease progression seen on his scan. The patient also understands that he can stop his treatment anytime if he becomes more symptomatic. He will proceed with his scheduled cycle of immunotherapy today. He would come back for followup visit in 2 weeks with the next cycle of his treatment For depression, he will continue on Remeron 30 mg by mouth each bedtime. For the metastatic bone disease, he will continue on treatment with Xgeva. The patient voices understanding of current disease status and treatment options and is in agreement with the current care plan. All questions were answered. The patient knows to call the clinic with any problems, questions or concerns. We can certainly see the patient much sooner if necessary.  I spent 20 minutes counseling the patient face to face. The total time spent in the appointment was 30 minutes.

## 2013-02-17 NOTE — Patient Instructions (Signed)
[facility] Discharge Instructions for Patients Receiving Chemotherapy  Today you received the following chemotherapy agent: Nivolumab   To help prevent nausea and vomiting after your treatment, we encourage you to take your nausea medication as prescribed.    If you develop nausea and vomiting that is not controlled by your nausea medication, call the clinic.   BELOW ARE SYMPTOMS THAT SHOULD BE REPORTED IMMEDIATELY:  *FEVER GREATER THAN 100.5 F  *CHILLS WITH OR WITHOUT FEVER  NAUSEA AND VOMITING THAT IS NOT CONTROLLED WITH YOUR NAUSEA MEDICATION  *UNUSUAL SHORTNESS OF BREATH  *UNUSUAL BRUISING OR BLEEDING  TENDERNESS IN MOUTH AND THROAT WITH OR WITHOUT PRESENCE OF ULCERS  *URINARY PROBLEMS  *BOWEL PROBLEMS  UNUSUAL RASH Items with * indicate a potential emergency and should be followed up as soon as possible.  Feel free to call the clinic you have any questions or concerns. The clinic phone number is [phone].    

## 2013-02-17 NOTE — Patient Instructions (Signed)
Immunotherapy with Nivolumab today as scheduled.  Followup visit in 2 weeks.

## 2013-02-18 ENCOUNTER — Telehealth: Payer: Self-pay | Admitting: Internal Medicine

## 2013-02-18 NOTE — Telephone Encounter (Signed)
worked 12/23 POF but appts already made for 03/05/13 shh

## 2013-02-24 ENCOUNTER — Ambulatory Visit: Payer: BC Managed Care – PPO | Admitting: Physician Assistant

## 2013-02-24 ENCOUNTER — Other Ambulatory Visit: Payer: BC Managed Care – PPO

## 2013-02-25 ENCOUNTER — Telehealth: Payer: Self-pay | Admitting: Dietician

## 2013-02-25 NOTE — Telephone Encounter (Signed)
Nutrition Brief Note  Patient identified on the [facility] Nutrition Screen.    Wt Readings from Last 15 Encounters:  02/17/13 169 lb 1.6 oz (76.703 kg)  02/10/13 172 lb 8 oz (78.245 kg)  01/14/13 179 lb 4.8 oz (81.33 kg)  12/16/12 189 lb 4.8 oz (85.866 kg)  12/04/12 189 lb 1.6 oz (85.775 kg)  11/28/12 187 lb 6.4 oz (85.004 kg)  11/05/12 191 lb 1.6 oz (86.682 kg)   Chart reviewed. Pt metastatic lung cancer and adenocarcinoma. He received chemotherapy and palliative radiotherapy at [facility]. Recent CT scan showed progression of disease. Pt is currently undergoing a clinical trial of immunotherapy at [facility].   Chart review from Care Everywhere reports that pt received nutritional counseling at [facility]. Last visit was 08/05/12. At that time pt was losing weight, but eating well and taking nutritional supplements.  Weight hx reveals a 11.5% wt loss x 3 months (which is clinically significant), 5.6% wt loss x 1 month (which is clinically significant, and a 1.7% wt loss x 1 week.   Called at 1518, however, line was busy at time of call.  If further nutrition issues arise, please consult [facility] RD.  Radie Berges A. Mayford Knife, RD, LDN Pager: [phone]

## 2013-03-03 ENCOUNTER — Ambulatory Visit: Payer: BC Managed Care – PPO

## 2013-03-04 ENCOUNTER — Encounter: Payer: Self-pay | Admitting: *Deleted

## 2013-03-04 NOTE — Progress Notes (Signed)
03/04/13 @ 9:30 am, BMS [id number], Discontinuation of Study Treatment:   Received an email from Cameron Berry wife saying that he will have to discontinue taking part in this clinical trial.  Spoke with Dr. Julien Nordmann who says that he received a text from Dr. Kinnie Scales at [facility] stating that Cameron Berry had been hospitalized at [facility] with a pain crisis and has subsequently been referred to hospice.  His plan is not to receive further treatment aside from comfort care.  Will attempt to obtain medical records in order to determine whether expedited reporting is required.  03/06/13 @ 1:10 pm:  SAE reporting was completed for hospitalization for pain control, and reported to the IRB.  The patient's intent is to have care in [place] with Dr. Consuello Masse [facility].  He will not travel to [facility] for follow up.  Dr. Julien Nordmann is aware and in agreement with that plan.  Unable to obtain a written withdrawal of treatment from the patient, but do have an e-mail from his wife expressing his intent.  Reviewed contents of ED visit and visit with Dr. Kinnie Scales in [place] within [facility].

## 2013-03-05 ENCOUNTER — Ambulatory Visit: Payer: BC Managed Care – PPO

## 2013-03-05 ENCOUNTER — Ambulatory Visit: Payer: BC Managed Care – PPO | Admitting: Internal Medicine

## 2013-03-05 ENCOUNTER — Other Ambulatory Visit: Payer: BC Managed Care – PPO

## 2013-03-09 ENCOUNTER — Ambulatory Visit: Payer: BC Managed Care – PPO | Admitting: Physician Assistant

## 2013-03-19 ENCOUNTER — Ambulatory Visit: Payer: BC Managed Care – PPO | Admitting: Physician Assistant

## 2013-03-19 ENCOUNTER — Other Ambulatory Visit: Payer: BC Managed Care – PPO

## 2013-04-23 ENCOUNTER — Other Ambulatory Visit: Payer: Self-pay | Admitting: *Deleted

## 2013-04-26 DEATH — deceased

## 2013-10-04 IMAGING — CT CT ABDOMEN W/ CM
2 of 4 series · 15 of 46 positions shown, 17 images · IV contrast (OMNIPAQUE)
Comparison: None

ADDENDUM:
RECIST VERSION

TARGET LESIONS:
1. PLEURAL TUMOR OVERLYING THE ANTERIOR RIGHT UPPER LOBE MEASURES
2.2 CM, IMAGE 14/ SERIES [DATE]. ANTERIOR MEDIAL RIGHT UPPER LOBE PLEURAL TUMOR EXTENDING INTO THE
RIGHT CHEST WALL MEASURES 2.5 CM, IMAGE 39/ SERIES [DATE]. PLEURAL BASED TUMOR WITHIN THE MEDIAL ASPECT OF THE RIGHT LOWER
LOBE MEASURES 3.9 CM, IMAGE 27/ SERIES 2.
NON TARGET LESIONS:
1. LOCULATED PLEURAL FLUID WITHIN THE RIGHT LUNG BASE IS IDENTIFIED.
2. THERE IS EXTENSIVE INTERSTITIAL THICKENING WITHIN THE RIGHT LOWER
LOBE AND RIGHT MIDDLE LOBE WHICH IS CONCERNING FOR LYMPHANGITIC
SPREAD OF TUMOR.
3. 3. SCLEROTIC LESION WITHIN THE T12 VERTEBRA IS NOTED.
Key images saved under "RECIST VERSION 1.1" presentation state.
CLINICAL DATA: Restaging lung cancer
EXAM:
CT CHEST AND ABDOMEN WITH CONTRAST
TECHNIQUE: Multidetector CT imaging of the chest and abdomen was performed
following the standard protocol during bolus administration of
intravenous contrast.
CONTRAST:  100mL OMNIPAQUE IOHEXOL 300 MG/ML  SOLN

[Series 2: ca with st · axial · 0.73mm/px · z∈[-427,+13]mm · 12 of 98 slices shown, 14 images]
[im 5/98  soft-tissue]
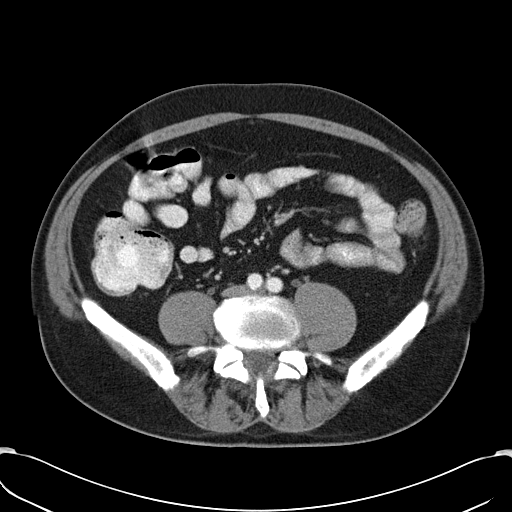
[im 5/98  bone]
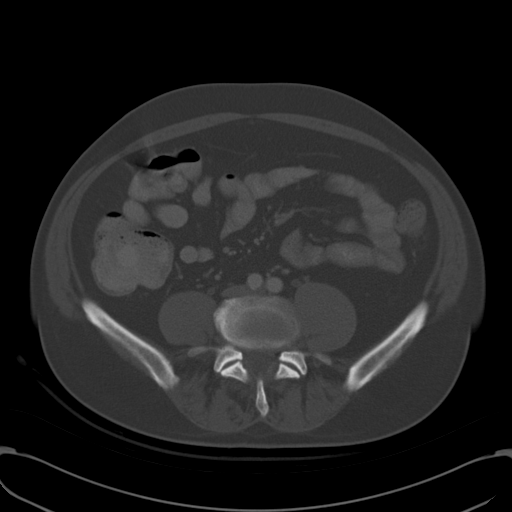
[im 13/98  soft-tissue]
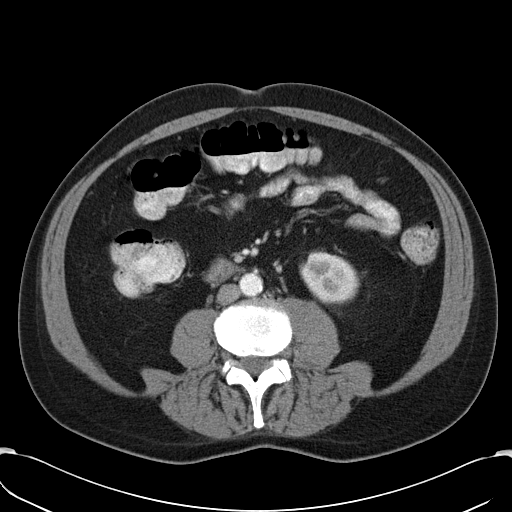
[im 22/98  soft-tissue]
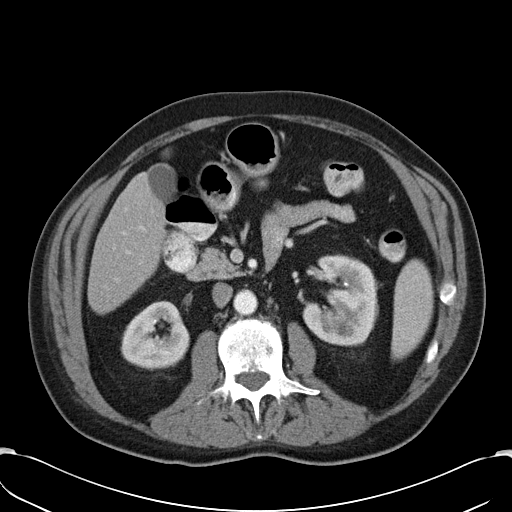
[im 30/98  soft-tissue]
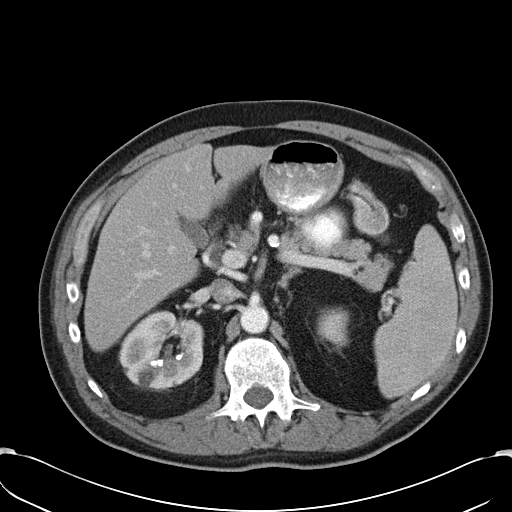
[im 38/98  soft-tissue]
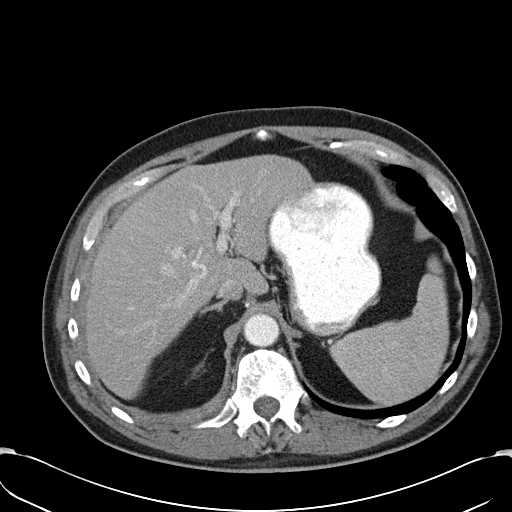
[im 47/98  soft-tissue]
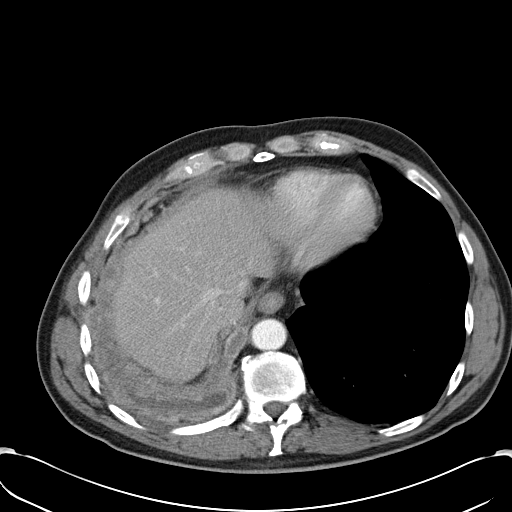
[im 51/98  soft-tissue]
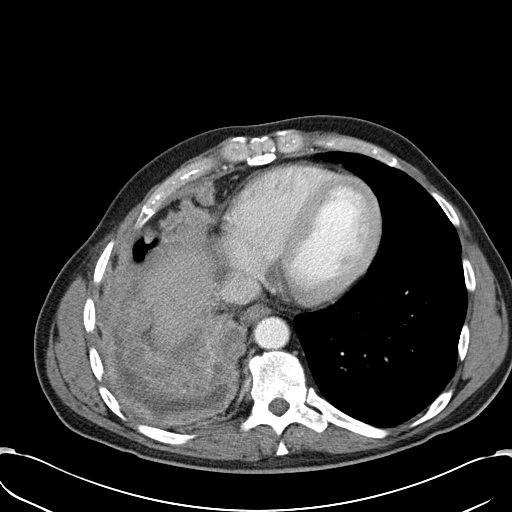
[im 60/98  soft-tissue]
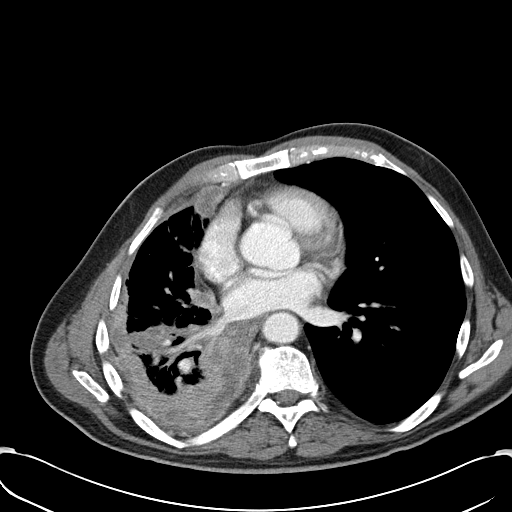
[im 68/98  soft-tissue]
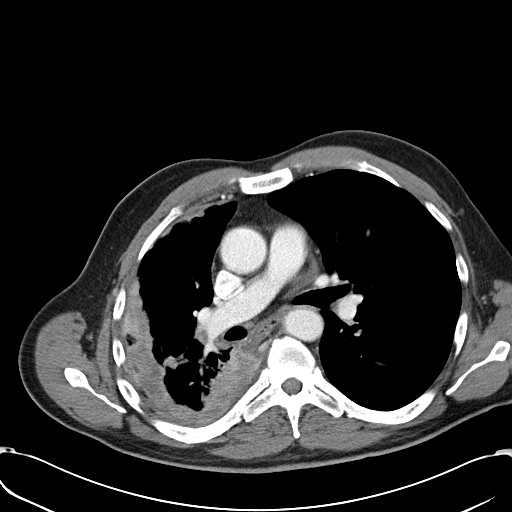
[im 68/98  bone]
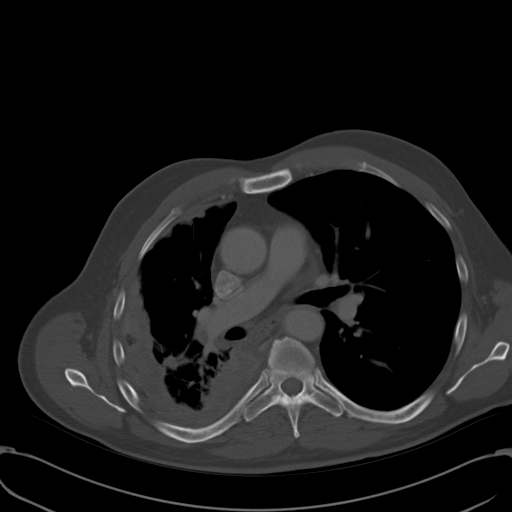
[im 76/98  soft-tissue]
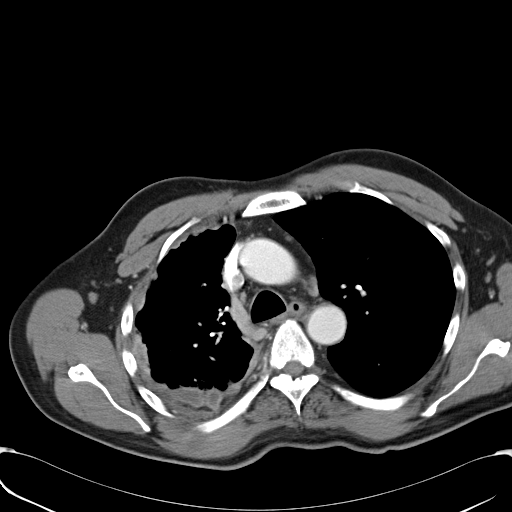
[im 85/98  soft-tissue]
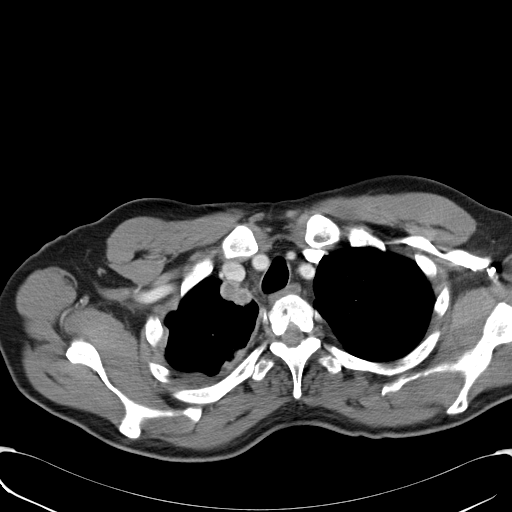
[im 93/98  soft-tissue]
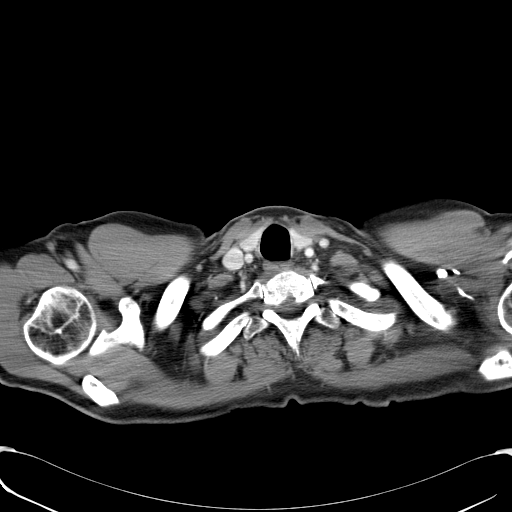

[Series 602: <mpr thick range> · coronal · 0.95mm/px · 3 of 93 slices shown]
[im 31/93  soft-tissue]
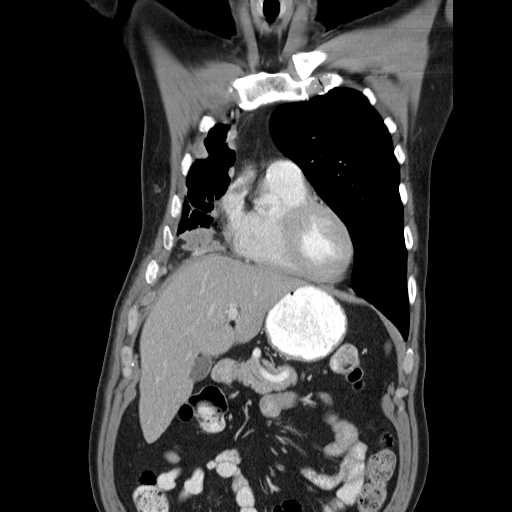
[im 41/93  soft-tissue]
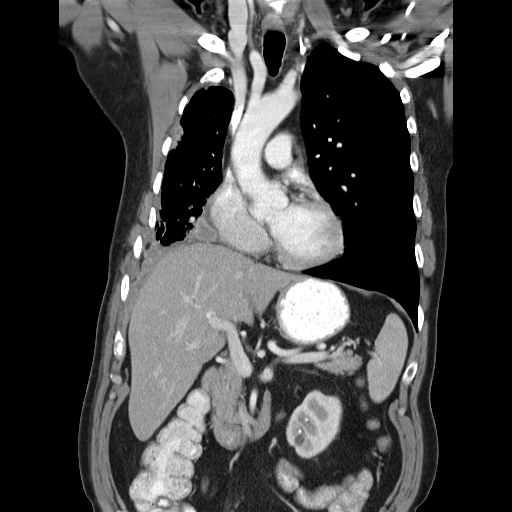
[im 52/93  soft-tissue]
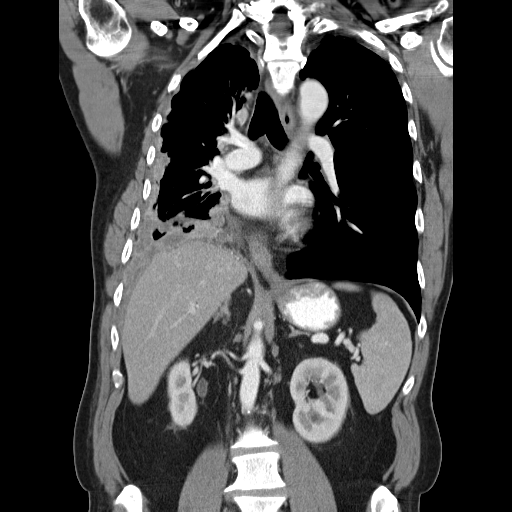

[15 of 46 positions shown; findings below may reference images not displayed]

FINDINGS: CT CHEST FINDINGS

Extensive pleural based tumor is identified encasing right lung.
Within the posterior-medial right lung base the tumor measures
cm in thickness, image 43/ series 2. At the level of the carina the
posterior medial tumor measures 2.4 cm, image 26/ series 2. At the
level of the thoracic inlet the paramediastinal pleural tumor
measures 10 mm in thickness, image 15/ series 2. There is
interlobular septal thickening within the right lung which may
indicate lymphangitic spread of tumor.

The trachea appears patent and is midline. Multiple small
mediastinal lymph nodes are identified. No right paratracheal or
sub- carinal adenopathy identified. No contralateral mediastinal or
left hilar adenopathy noted. The right hilum is partially encased by
pleural tumor.

No axillary or supraclavicular adenopathy identified. Review of the
visualized bony structures shows no aggressive lytic or sclerotic
bone lesions. There is a nonspecific sclerotic focus within the T12
vertebra measuring 1.5 cm, image 60/ series 5.

CT ABDOMEN AND PELVIS FINDINGS

There is no focal liver abnormality. The gallbladder appears within
normal limits. Normal appearance of the pancreas. The spleen is
unremarkable.

The adrenal glands both appear normal. Multiple bilateral renal
calculi are identified. The largest right renal stone measures 7 mm,
image 67/ series 2. The largest a left renal calculus measures 5 mm,
image 83/ series 2. Bilateral renal cysts are noted.

Normal caliber of the abdominal aorta. No aneurysm. No upper
abdominal adenopathy identified. No free fluid or fluid collections
identified. The upper abdominal bowel loops are within normal
limits.

Review of the visualized osseous structures shows no aggressive
lytic or sclerotic bone lesions.
IMPRESSION: CT CHEST IMPRESSION

1. Examination is positive for extensive transpleural spread of
tumor encasing the right lung.

2. Interstitial thickening within the right base is noted. This may
indicate lymphangitic spread of tumor within the right lung. 3.
Nonspecific sclerotic focus within the T12 vertebral body. Cannot
rule out sclerotic bone metastasis. Attention on followup imaging is
advised.

CT ABDOMEN AND PELVIS IMPRESSION

1. No specific features identified to suggest metastatic disease to
the upper abdomen.

2. Bilateral renal calculi.
